# Patient Record
Sex: Male | Born: 1950 | Race: White | Hispanic: No | Marital: Single | Smoking: Current some day smoker
Health system: Southern US, Community
[De-identification: ages and names within clinical notes are randomized; demographics above are authoritative.]

## PROBLEM LIST (undated history)

## (undated) DIAGNOSIS — I480 Paroxysmal atrial fibrillation: Secondary | ICD-10-CM

## (undated) DIAGNOSIS — R9431 Abnormal electrocardiogram [ECG] [EKG]: Secondary | ICD-10-CM

## (undated) DIAGNOSIS — I4892 Unspecified atrial flutter: Secondary | ICD-10-CM

## (undated) DIAGNOSIS — G4730 Sleep apnea, unspecified: Secondary | ICD-10-CM

## (undated) DIAGNOSIS — E7800 Pure hypercholesterolemia, unspecified: Secondary | ICD-10-CM

## (undated) DIAGNOSIS — I10 Essential (primary) hypertension: Secondary | ICD-10-CM

## (undated) HISTORY — DX: Abnormal electrocardiogram (ECG) (EKG): R94.31

## (undated) HISTORY — DX: Essential (primary) hypertension: I10

## (undated) HISTORY — DX: Sleep apnea, unspecified: G47.30

## (undated) HISTORY — DX: Paroxysmal atrial fibrillation: I48.0

---
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
## 2013-06-28 ENCOUNTER — Ambulatory Visit (INDEPENDENT_AMBULATORY_CARE_PROVIDER_SITE_OTHER): Payer: BC Managed Care – PPO | Admitting: Physician Assistant

## 2013-06-28 VITALS — BP 148/86 | HR 78 | Temp 97.9°F | Resp 18 | Ht 70.0 in | Wt 184.0 lb

## 2013-06-28 DIAGNOSIS — M79609 Pain in unspecified limb: Secondary | ICD-10-CM

## 2013-06-28 DIAGNOSIS — M79604 Pain in right leg: Secondary | ICD-10-CM

## 2013-06-28 DIAGNOSIS — L089 Local infection of the skin and subcutaneous tissue, unspecified: Secondary | ICD-10-CM

## 2013-06-28 MED ORDER — DOXYCYCLINE HYCLATE 100 MG PO CAPS: 100.0000 mg | ORAL_CAPSULE | Freq: Two times a day (BID) | ORAL | Status: DC

## 2013-06-28 NOTE — Progress Notes (Signed)
  Subjective:    Patient ID: Jacob Meadows, male    DOB: 1951/10/13, 62 y.o.   MRN: [id number]  HPI 62 y.o. Male presents to clinic today for splinter in his right leg x 4 days. Patient states that it is only painful directly over area where splinter entered and he states that his leg feels kind of heavy. Patient was gardening and has wooden posts in his yard that he hit his leg on. Patient pulled a large piece of wood out and he thought he got it all but the area has since become swollen and red. He has been putting alcohol and antibiotic ointment on it without any relief. His wife advised him that he better come get it looked at. He has not had any fevers, chills, numbness or tingling in is legs, or pain radiating up his leg. Patient up to date on Tetanus shot (2012). Patient takes no medications and has NKDA.    Review of Systems  All other systems reviewed and are negative.      Objective:   Physical Exam  Nursing note and vitals reviewed. Constitutional: He is oriented to person, place, and time. Vital signs are normal. He appears well-developed and well-nourished. No distress.  HENT:  Head: Normocephalic.  Right Ear: External ear normal.  Left Ear: External ear normal.  Nose: Nose normal.  Eyes: Conjunctivae and lids are normal.  Neck: Trachea normal and normal range of motion.  Cardiovascular: Normal rate, regular rhythm and normal heart sounds.   Pulmonary/Chest: Effort normal and breath sounds normal.  Musculoskeletal: Normal range of motion.  Neurological: He is alert and oriented to person, place, and time. He has normal strength. No sensory deficit.  Skin: Skin is warm and dry. Lesion noted. He is not diaphoretic. There is erythema. No pallor.  Erythematous and small fluctuant area on lateral distal area skin over fibula. Scab present due to FB puncture.  Psychiatric: He has a normal mood and affect. His speech is normal and behavior is normal. Judgment and thought content  normal. Cognition and memory are normal.    Procedure: consent obtained for splinter removal. Scab removed and culture obtained of purulent drainage. Not able to identify FB. Area anesthetized with 1 cc of 2% lidocaine. Area cleaned with betadine. Splinter forceps used to explore wound bed and FB removed. Area cleaned and dressed with antibiotic ointment and a Band-Aid. Wound care discussed.     Assessment & Plan:  Foreign body of leg, right, superficial, infected, initial encounter - Plan: Wound culture, doxycycline (VIBRAMYCIN) 100 MG capsule. Instructed patient to take doxycycline as directed.  Instructed patient to wash daily with soap and water. He can  Continue to apply a dab of antibiotic ointment twice daily. He may cover it with a bandage if it's draining, but otherwise, leave it open to the air.  Will contact patient with results of wound culture when they are a available.

## 2013-06-28 NOTE — Patient Instructions (Addendum)
Wash daily with soap and water. Apply a dab of antibiotic ointment twice daily. You may cover it with a bandage if it's draining, but otherwise, leave it open to the air.  I will contact you with your lab results as soon as they are available.   If you have not heard from me in 2 weeks, please contact me.  The fastest way to get your results is to register for My Chart (see the instructions on the last page of this printout).

## 2013-07-02 NOTE — Progress Notes (Signed)
I have examined this patient, I directly supervised and participated in the procedure, and agree with the student's documentation.

## 2013-09-26 ENCOUNTER — Other Ambulatory Visit: Payer: Self-pay

## 2015-07-24 ENCOUNTER — Ambulatory Visit (INDEPENDENT_AMBULATORY_CARE_PROVIDER_SITE_OTHER): Payer: BLUE CROSS/BLUE SHIELD | Admitting: Physician Assistant

## 2015-07-24 VITALS — BP 168/92 | HR 97 | Temp 97.9°F | Resp 18 | Ht 70.0 in | Wt 192.0 lb

## 2015-07-24 DIAGNOSIS — H6502 Acute serous otitis media, left ear: Secondary | ICD-10-CM | POA: Diagnosis not present

## 2015-07-24 DIAGNOSIS — R030 Elevated blood-pressure reading, without diagnosis of hypertension: Secondary | ICD-10-CM

## 2015-07-24 DIAGNOSIS — H60392 Other infective otitis externa, left ear: Secondary | ICD-10-CM

## 2015-07-24 DIAGNOSIS — IMO0001 Reserved for inherently not codable concepts without codable children: Secondary | ICD-10-CM

## 2015-07-24 MED ORDER — AMOXICILLIN 875 MG PO TABS: 875.0000 mg | ORAL_TABLET | Freq: Two times a day (BID) | ORAL | Status: DC

## 2015-07-24 MED ORDER — CIPROFLOXACIN-DEXAMETHASONE 0.3-0.1 % OT SUSP: 4.0000 [drp] | Freq: Two times a day (BID) | OTIC | Status: DC

## 2015-07-24 NOTE — Progress Notes (Signed)
07/24/2015 at 1:18 PM  Barbette Merino / DOB: Dec 09, 1950 / MRN: [id number]  The patient  does not have a problem list on file.  SUBJECTIVE  Jacob Meadows is a 64 y.o. well appearing male presenting for the chief complaint of left sided external ear pain and otorrhea for 5 days.  Reports he went to his audiologist this morning and was advised to come here for further evaluation. He also complains of decreased hearing in the left ear and is constantly "having to wipe my ear." Associates low grade fevers but has not measured. He has been trying pseudoephedrine for this problem without relief.  He has had this problem before roughly 6 years ago.  He denies a history of diabetes and receives annual care from his PCP.  He has a history of "white coat HTN" and is managed by his PCP for the problem.     He  has no past medical history on file.    Medications reviewed and updated by myself where necessary, and exist elsewhere in the encounter.   Jacob Meadows has No Known Allergies. He  reports that he has never smoked. He does not have any smokeless tobacco history on file. He reports that he drinks about 0.6 - 1.8 oz of alcohol per week. He reports that he does not use illicit drugs. He  has no sexual activity history on file. The patient  has no past surgical history on file.  His family history is not on file.  Review of Systems  Constitutional: Positive for fever.  Eyes: Negative for blurred vision.  Respiratory: Negative for cough.   Gastrointestinal: Negative for nausea and abdominal pain.  Skin: Negative for rash.  Neurological: Negative for dizziness and headaches.    OBJECTIVE  His  height is  (1.778 m) and weight is 192 lb (87.091 kg). His oral temperature is 97.9 F (36.6 C). His blood pressure is 168/92 and his pulse is 97. His respiration is 18 and oxygen saturation is 100%.  The patient's body mass index is 27.55 kg/(m^2).  Physical Exam  Vitals reviewed. Constitutional: He  is oriented to person, place, and time. He appears well-developed. No distress.  HENT:  Right Ear: Hearing and tympanic membrane normal.  Left Ear: There is drainage and tenderness. A middle ear effusion is present.  Ears:  Nose: Nose normal.  Mouth/Throat: Uvula is midline, oropharynx is clear and moist and mucous membranes are normal.  Eyes: EOM are normal. Pupils are equal, round, and reactive to light. No scleral icterus.  Neck: Normal range of motion.  Cardiovascular: Normal rate and regular rhythm.   Respiratory: Effort normal and breath sounds normal.  GI: He exhibits no distension.  Musculoskeletal: Normal range of motion.  Neurological: He is alert and oriented to person, place, and time. No cranial nerve deficit.  Skin: Skin is warm and dry. No rash noted. He is not diaphoretic.  Psychiatric: He has a normal mood and affect.    No results found for this or any previous visit (from the past 24 hour(s)).  ASSESSMENT & PLAN  Jacob Meadows was seen today for ear pain.  Diagnoses and all orders for this visit:  Otitis, externa, infective, left -     ciprofloxacin-dexamethasone (CIPRODEX) otic suspension; Place 4 drops into the left ear 2 (two) times daily.  Acute serous otitis media of left ear, recurrence not specified -     amoxicillin (AMOXIL) 875 MG tablet; Take 1 tablet (875 mg total)  by mouth 2 (two) times daily.  Elevated blood pressure: Patient sees his PCP and reports a history of white coat HTN.       The patient was advised to call or come back to clinic if he does not see an improvement in symptoms, or worsens with the above plan.   Deliah Boston, MHS, PA-C [facility] 07/24/2015 1:18 PM

## 2015-09-13 NOTE — Progress Notes (Signed)
  Medical screening examination/treatment/procedure(s) were performed by non-physician practitioner and as supervising physician I was immediately available for consultation/collaboration.     

## 2015-11-24 ENCOUNTER — Ambulatory Visit (INDEPENDENT_AMBULATORY_CARE_PROVIDER_SITE_OTHER): Payer: BLUE CROSS/BLUE SHIELD

## 2015-11-24 ENCOUNTER — Encounter: Payer: Self-pay | Admitting: Internal Medicine

## 2015-11-24 ENCOUNTER — Ambulatory Visit (INDEPENDENT_AMBULATORY_CARE_PROVIDER_SITE_OTHER): Payer: BLUE CROSS/BLUE SHIELD | Admitting: Family Medicine

## 2015-11-24 ENCOUNTER — Ambulatory Visit (INDEPENDENT_AMBULATORY_CARE_PROVIDER_SITE_OTHER): Payer: BLUE CROSS/BLUE SHIELD | Admitting: Internal Medicine

## 2015-11-24 VITALS — BP 176/106 | HR 122 | Temp 98.0°F | Resp 18 | Ht 69.5 in | Wt 201.0 lb

## 2015-11-24 VITALS — BP 180/104 | HR 86 | Ht 70.0 in | Wt 204.0 lb

## 2015-11-24 DIAGNOSIS — R059 Cough, unspecified: Secondary | ICD-10-CM

## 2015-11-24 DIAGNOSIS — R000 Tachycardia, unspecified: Secondary | ICD-10-CM | POA: Diagnosis not present

## 2015-11-24 DIAGNOSIS — R9431 Abnormal electrocardiogram [ECG] [EKG]: Secondary | ICD-10-CM

## 2015-11-24 DIAGNOSIS — R05 Cough: Secondary | ICD-10-CM | POA: Diagnosis not present

## 2015-11-24 DIAGNOSIS — G478 Other sleep disorders: Secondary | ICD-10-CM | POA: Diagnosis not present

## 2015-11-24 DIAGNOSIS — I481 Persistent atrial fibrillation: Secondary | ICD-10-CM

## 2015-11-24 DIAGNOSIS — I10 Essential (primary) hypertension: Secondary | ICD-10-CM | POA: Insufficient documentation

## 2015-11-24 DIAGNOSIS — J0110 Acute frontal sinusitis, unspecified: Secondary | ICD-10-CM

## 2015-11-24 DIAGNOSIS — R030 Elevated blood-pressure reading, without diagnosis of hypertension: Secondary | ICD-10-CM | POA: Diagnosis not present

## 2015-11-24 DIAGNOSIS — I480 Paroxysmal atrial fibrillation: Secondary | ICD-10-CM

## 2015-11-24 DIAGNOSIS — I4819 Other persistent atrial fibrillation: Secondary | ICD-10-CM | POA: Insufficient documentation

## 2015-11-24 DIAGNOSIS — I4891 Unspecified atrial fibrillation: Secondary | ICD-10-CM

## 2015-11-24 DIAGNOSIS — G4730 Sleep apnea, unspecified: Secondary | ICD-10-CM

## 2015-11-24 DIAGNOSIS — IMO0001 Reserved for inherently not codable concepts without codable children: Secondary | ICD-10-CM

## 2015-11-24 HISTORY — DX: Paroxysmal atrial fibrillation: I48.0

## 2015-11-24 HISTORY — DX: Abnormal electrocardiogram (ECG) (EKG): R94.31

## 2015-11-24 HISTORY — DX: Sleep apnea, unspecified: G47.30

## 2015-11-24 HISTORY — DX: Essential (primary) hypertension: I10

## 2015-11-24 LAB — COMPREHENSIVE METABOLIC PANEL
ALT: 44 U/L (ref 9–46)
AST: 22 U/L (ref 10–35)
Albumin: 3.9 g/dL (ref 3.6–5.1)
Alkaline Phosphatase: 68 U/L (ref 40–115)
BILIRUBIN TOTAL: 0.7 mg/dL (ref 0.2–1.2)
BUN: 14 mg/dL (ref 7–25)
CALCIUM: 8.9 mg/dL (ref 8.6–10.3)
CHLORIDE: 103 mmol/L (ref 98–110)
CO2: 24 mmol/L (ref 20–31)
Creat: 0.87 mg/dL (ref 0.70–1.25)
GLUCOSE: 178 mg/dL — AB (ref 65–99)
Potassium: 4.1 mmol/L (ref 3.5–5.3)
SODIUM: 137 mmol/L (ref 135–146)
Total Protein: 5.8 g/dL — ABNORMAL LOW (ref 6.1–8.1)

## 2015-11-24 LAB — MAGNESIUM: Magnesium: 1.8 mg/dL (ref 1.5–2.5)

## 2015-11-24 LAB — CBC WITH DIFFERENTIAL/PLATELET
BASOS ABS: 0 10*3/uL (ref 0.0–0.1)
BASOS PCT: 0 % (ref 0–1)
EOS ABS: 0.1 10*3/uL (ref 0.0–0.7)
Eosinophils Relative: 1 % (ref 0–5)
HCT: 39.4 % (ref 39.0–52.0)
Hemoglobin: 13.4 g/dL (ref 13.0–17.0)
Lymphocytes Relative: 13 % (ref 12–46)
Lymphs Abs: 1.1 10*3/uL (ref 0.7–4.0)
MCH: 31 pg (ref 26.0–34.0)
MCHC: 34 g/dL (ref 30.0–36.0)
MCV: 91.2 fL (ref 78.0–100.0)
MONOS PCT: 8 % (ref 3–12)
MPV: 8.6 fL (ref 8.6–12.4)
Monocytes Absolute: 0.7 10*3/uL (ref 0.1–1.0)
NEUTROS PCT: 78 % — AB (ref 43–77)
Neutro Abs: 6.5 10*3/uL (ref 1.7–7.7)
PLATELETS: 281 10*3/uL (ref 150–400)
RBC: 4.32 MIL/uL (ref 4.22–5.81)
RDW: 13.8 % (ref 11.5–15.5)
WBC: 8.3 10*3/uL (ref 4.0–10.5)

## 2015-11-24 LAB — TSH: TSH: 1.753 u[IU]/mL (ref 0.350–4.500)

## 2015-11-24 MED ORDER — APIXABAN 5 MG PO TABS: 5.0000 mg | ORAL_TABLET | Freq: Two times a day (BID) | ORAL | Status: DC

## 2015-11-24 MED ORDER — CEFDINIR 300 MG PO CAPS: 300.0000 mg | ORAL_CAPSULE | Freq: Two times a day (BID) | ORAL | Status: DC

## 2015-11-24 MED ORDER — METOPROLOL TARTRATE 50 MG PO TABS: 50.0000 mg | ORAL_TABLET | Freq: Two times a day (BID) | ORAL | Status: DC

## 2015-11-24 NOTE — Progress Notes (Signed)
ELECTROPHYSIOLOGY CONSULT NOTE  Patient ID: Jacob Meadows, MRN: [id number], DOB/AGE: 66/01/1951 66 y.o. Admit date: (Not on file) Date of Consult: 11/24/2015  Primary Physician: [name],[name], MD Primary Cardiologist: new  Chief Complaint *atrial fibrillation    HPI Jacob Meadows is a 65 y.o. male  Referred from primary care after he presented this morning with a six-week history of worsening exercise intolerance faint sense of palpitations and being ill at ease. He was found to be in atrial fibrillation with modestly rapid rate. He has not had lightheadedness. He has had some mild dyspnea. He has not had chest pain. He has not had edema.  He has hypertension and a CHADS-VASc score of 2  No family history of heart disease No history of bleeding issues   Past Medical History  Diagnosis Date  . Essential hypertension 11/24/2015  . Persistent atrial fibrillation (HCC) 11/24/2015      Surgical History: No past surgical history on file.   Home Meds: Prior to Admission medications   Medication Sig Start Date End Date Taking? Authorizing Provider  cefdinir (OMNICEF) 300 MG capsule Take 1 capsule (300 mg total) by mouth 2 (two) times daily. 11/24/15  Yes Gwenlyn FoundJessica C Copland, MD  metoprolol (LOPRESSOR) 50 MG tablet Take 1 tablet (50 mg total) by mouth 2 (two) times daily. Increase dose as directed 11/24/15  Yes Pearline CablesJessica C Copland, MD    Allergies: No Known Allergies  Social History   Social History  . Marital Status: Married    Spouse Name: N/A  . Number of Children: N/A  . Years of Education: N/A   Occupational History  . Not on file.   Social History Main Topics  . Smoking status: Never Smoker   . Smokeless tobacco: Never Used  . Alcohol Use: 0.6 - 1.8 oz/week    1-3 Standard drinks or equivalent per week  . Drug Use: No  . Sexual Activity: Not on file   Other Topics Concern  . Not on file   Social History Narrative     Family History  Problem Relation Age of  Onset  . Alzheimer's disease Mother   . Congestive Heart Failure Father      ROS:  Please see the history of present illness.    All other systems reviewed and negative.    Physical Exam:   Blood pressure 180/104, pulse 86, height 5\' 10"  (1.778 m), weight 204 lb (92.534 kg). General: Well developed, well nourished male in no acute distress. Head: Normocephalic, atraumatic, sclera non-icteric, no xanthomas, nares are without discharge. EENT: normal  Lymph Nodes:  none Neck: Negative for carotid bruits. JVD 8-10 Back:without scoliosis kyphosis  Lungs: Clear bilaterally to auscultation without wheezes, rales, or rhonchi. Breathing is unlabored. Heart  Irregularly irregular RR with S1 S2. No  systolic  murmur . No rubs, or gallops appreciated. Abdomen: Soft, non-tender, non-distended with normoactive bowel sounds. No hepatomegaly. No rebound/guarding. No obvious abdominal masses. Msk:  Strength and tone appear normal for age. Extremities: No clubbing or cyanosis. No*  edema.  Distal pedal pulses are 2+ and equal bilaterally. Skin: Warm and Dry Neuro: Alert and oriented X 3. CN III-XII intact Grossly normal sensory and motor function . Psych:  Responds to questions appropriately with a normal affect.      Labs: Cardiac Enzymes No results for input(s): CKTOTAL, CKMB, TROPONINI in the last 72 hours. CBC No results found for: WBC, HGB, HCT, MCV, PLT PROTIME: No results for  input(s): LABPROT, INR in the last 72 hours. Chemistry No results for input(s): NA, K, CL, CO2, BUN, CREATININE, CALCIUM, PROT, BILITOT, ALKPHOS, ALT, AST, GLUCOSE in the last 168 hours.  Invalid input(s): LABALBU Lipids No results found for: CHOL, HDL, LDLCALC, TRIG BNP No results found for: PROBNP Thyroid Function Tests: No results for input(s): TSH, T4TOTAL, T3FREE, THYROIDAB in the last 72 hours.  Invalid input(s): FREET3 Miscellaneous No results found for: DDIMER  Radiology/Studies:  Dg Chest 2  View  11/24/2015  CLINICAL DATA:  Two weeks of chest discomfort and cough, history of atrial fibrillation. EXAM: CHEST  2 VIEW COMPARISON:  None in PACs FINDINGS: The lungs are well-expanded. There is mild hemidiaphragm flattening on the lateral image. There is no alveolar infiltrate. The interstitial markings are mildly increased in the lower lung zones. There is no significant pleural effusion. The cardiac silhouette is enlarged. The pulmonary vascularity is mildly prominent centrally. The bony thorax exhibits no acute abnormality. IMPRESSION: 1. Mild bibasilar interstitial prominence may reflect acute bronchitic change. No discrete pneumonia is evident. 2. Mild cardiomegaly with very mild central pulmonary vascular prominence may reflect low-grade compensated CHF. Electronically Signed   By: David  [place] M.D.   On: 11/24/2015 12:01    EKG: atrial fibrillation at 106 Intervals-/09/33 Low-voltage limb leads   Assessment and Plan: * Atrial fibrillation-persistent  Hypertension  Congestive heart failure  Sleep disordered breathing  Low voltage limb leads    The patient has symptoms of exercise intolerance dating back about 6 weeks to Thanksgiving. I suspect he has been in atrial fibrillation since then easy as he had some concomitant palpitations.  We have reviewed the physiology of atrial fibrillation specifically, we have reviewed the timing and priming function of the atrium, the impact on rate because of atrial fibrillation high rates and our efforts to attenuate ventricular response by pharmacological manipulation of the AV node.  We discussed the potential effects on exercise tolerance related to rate irregularity and loss of priming function we discussed potential risks of thromboembolism. He has a CHADS-VASc score of greater than or equal to 2 . We discussed the use of the NOACs compared to Coumadin. We briefly reviewed the data of at least comparability in stroke prevention,  bleeding and outcome. We discussed some of the new once wherein somewhat associated with decreased ischemic stroke risk, one to be taken daily, and has been shown to be comparable and bleeding risk to aspirin.  We also discussed bleeding associated with warfarin as well as NOACs and a wall bleeding as a complication of all these drugs intracranial bleeding is more frequently associated with warfarin then the NOACs and a GI bleeding is more commonly associated with the latter  We will begin him on apixaban.  He was started on metoprolol 50 mg twice daily earlier today. This will be important for both rate control as well as effect on his blood pressure. I suspect he will need further antihypertensive therapy. Once we obtain his echocardiogram, if there is evidence of left ventricular hypertrophy use an ARB  We will look at his LV function and determine thereafter timing of cardioversion. If there is impaired LV function we will undertake cardioversion urgently otherwise we will think cardioversion in about 3 weeks time.  We have discussed the long-term likelihood of being sustained in sinus rhythm and the potential use of antiarrhythmic therapy and/or catheter ablation being informed by the frequency of recurrences  With the low voltage limb leads  Will have be  attentive to ventricular wall thickness   Sherryl Manges

## 2015-11-24 NOTE — Progress Notes (Signed)
[facility] [address], [place] [address] [phone]  Date:  11/24/2015   Name:  Jacob Meadows   DOB:  1951/06/16   MRN:  [id number]  PCP:  Jacob Kayser, MD    Chief Complaint: Sinusitis and Cough   History of Present Illness:  Jacob Meadows is a 65 y.o. very pleasant male patient who presents with the following:  "Jacob Meadows" is here today with ilness- he has noted possible sinus infection for about 2 weeks with facial and sinus congestion and pain.  Never a smoker Generally in good health  Over the summer he got water in the left ear which led to an ear infection- this was treated with amoxicillin and ciprodex in September and he finally did get better.   About 3 weeks ago he noted a lot of mucus and congestion in his sinuses.  He would have presure and pain when he bends over especially.  Worse for the last 2 weeks He was coughing up a lot of PND in the am.   He started wheezing about 3 days ago- he does not generally have any wheezing.   He has not noted any LE edema.  He does need to prop up to breathe at night but thinks this is due to nasal congestion and not to his lungs  He is taking some mucinex D.  He has "just not felt well" over the last 5-6 days.  Has noted some palpitations when asked about this.  However no CP or SOB.   He has felt a little shaky, just not quite himself  He does not have any heart concerns.  Never had a fib.  Does not have a cardiologist.  Never had CHF.  Never had a stress test  Wt Readings from Last 3 Encounters:  11/24/15 201 lb (91.173 kg)  07/24/15 192 lb (87.091 kg)  06/28/13 184 lb (83.462 kg)      BP Readings from Last 3 Encounters:  11/24/15 176/106  07/24/15 168/92  06/28/13 148/86     There are no active problems to display for this patient.   History reviewed. No pertinent past medical history.  History reviewed. No pertinent past surgical history.  Social History  Substance Use Topics  .  Smoking status: Never Smoker   . Smokeless tobacco: None  . Alcohol Use: 0.6 - 1.8 oz/week    1-3 Standard drinks or equivalent per week    Family History  Problem Relation Age of Onset  . Alzheimer's disease Mother   . Congestive Heart Failure Father     No Known Allergies  Medication list has been reviewed and updated.  No current outpatient prescriptions on file prior to visit.   No current facility-administered medications on file prior to visit.    Review of Systems:  As per HPI- otherwise negative.   Physical Examination: Filed Vitals:   11/24/15 1042  BP: 176/106  Pulse: 122  Temp: 98 F (36.7 C)  Resp: 18   Filed Vitals:   11/24/15 1042  Height: 5' 9.5" (1.765 m)  Weight: 201 lb (91.173 kg)   Body mass index is 29.27 kg/(m^2). Ideal Body Weight: Weight in (lb) to have BMI = 25: 171.4  GEN: WDWN, NAD, Non-toxic, A & O x 3, looks well HEENT: Atraumatic, Normocephalic. Neck supple. No masses, No LAD.  Bilateral TM wnl, oropharynx normal.  PEERL,EOMI.   Ears and Nose: No external deformity. CV: rapid irregular rate. No M/G/R.  No JVD. No thrill. No extra heart sounds. PULM: CTA B, no wheezes, crackles, rhonchi. No retractions. No resp. distress. No accessory muscle use. ABD: S, NT, ND, +BS. No rebound. No HSM. EXTR: No c/c/e NEURO Normal gait.  PSYCH: Normally interactive. Conversant. Not depressed or anxious appearing.  Calm demeanor.   EKG: atrial fib with rate 100- 120; RVR  [facility] reading (PRIMARY) by  Dr. Patsy Lageropland. CXR: mild cardiomegaly with mild pulmonary edema  CHEST 2 VIEW  COMPARISON: None in PACs  FINDINGS: The lungs are well-expanded. There is mild hemidiaphragm flattening on the lateral image. There is no alveolar infiltrate. The interstitial markings are mildly increased in the lower lung zones. There is no significant pleural effusion. The cardiac silhouette is enlarged. The pulmonary vascularity is mildly prominent centrally. The  bony thorax exhibits no acute abnormality.  IMPRESSION: 1. Mild bibasilar interstitial prominence may reflect acute bronchitic change. No discrete pneumonia is evident. 2. Mild cardiomegaly with very mild central pulmonary vascular prominence may reflect low-grade compensated CHF.  Assessment and Plan: Atrial fibrillation, unspecified type (HCC)  Tachycardia - Plan: EKG 12-Lead, DG Chest 2 View, metoprolol (LOPRESSOR) 50 MG tablet  Elevated BP  Acute frontal sinusitis, recurrence not specified - Plan: cefdinir (OMNICEF) 300 MG capsule  Cough - Plan: DG Chest 2 View  Here today with concern of a sinus infection- however also noted to be in A fib with borderline RVR and evidence of pulmonary congestion. Spoke with Dr. Graciela HusbandsKlein who kindly advised me to start him on metoprolol 50 BID and who will see the pt in clinic this afternoon.    rx for omnicef for a sinus infection  Gave him bottle of xarelto 15 mg samples in case this may be useful- asked him to consult with Dr. Graciela HusbandsKlein about the right anticoagulation for him  Signed Abbe AmsterdamJessica Copland, MD

## 2015-11-24 NOTE — Patient Instructions (Addendum)
[facility] this afternoon at 2pm to see Dr. Graciela HusbandsKlein at [facility]:30  [facility] [address] [place], [place] [address] Main: [phone]   In the meantime we are going to start you on an antibiotic (omnicef) for your sinuses and a beta blocker medication (metoprolol) to slow down your heart and bring down your blood pressure

## 2015-11-24 NOTE — Patient Instructions (Addendum)
Medication Instructions: 1) Start Eliquis 5 mg one tablet by mouth twice daily 2) Stop Aspirin  Labwork: - Your physician recommends that you have lab work today: CMET/ CBC/ Magnesium/ TSH  Procedures/Testing: - Your physician has requested that you have an echocardiogram (Friday 11/27/15 at 1:00 pm). Echocardiography is a painless test that uses sound waves to create images of your heart. It provides your doctor with information about the size and shape of your heart and how well your heart's chambers and valves are working. This procedure takes approximately one hour. There are no restrictions for this procedure.  - Your physician has recommended that you have a home sleep study. This test records several body functions during sleep, including: brain activity, eye movement, oxygen and carbon dioxide blood levels, heart rate and rhythm, breathing rate and rhythm, the flow of air through your mouth and nose, snoring, body muscle movements, and chest and belly movement.  Follow-Up: - Pending the results of your echocardiogram.  Any Additional Special Instructions Will Be Listed Below (If Applicable).

## 2015-11-27 ENCOUNTER — Other Ambulatory Visit (HOSPITAL_COMMUNITY): Payer: Self-pay | Admitting: Internal Medicine

## 2015-11-27 ENCOUNTER — Other Ambulatory Visit: Payer: Self-pay | Admitting: *Deleted

## 2015-11-27 ENCOUNTER — Ambulatory Visit (HOSPITAL_COMMUNITY): Payer: BLUE CROSS/BLUE SHIELD | Attending: Cardiovascular Disease

## 2015-11-27 ENCOUNTER — Other Ambulatory Visit: Payer: Self-pay

## 2015-11-27 ENCOUNTER — Other Ambulatory Visit (INDEPENDENT_AMBULATORY_CARE_PROVIDER_SITE_OTHER): Payer: BLUE CROSS/BLUE SHIELD | Admitting: *Deleted

## 2015-11-27 DIAGNOSIS — I10 Essential (primary) hypertension: Secondary | ICD-10-CM | POA: Diagnosis not present

## 2015-11-27 DIAGNOSIS — R7309 Other abnormal glucose: Secondary | ICD-10-CM | POA: Diagnosis not present

## 2015-11-27 DIAGNOSIS — I481 Persistent atrial fibrillation: Secondary | ICD-10-CM | POA: Diagnosis not present

## 2015-11-27 DIAGNOSIS — I4819 Other persistent atrial fibrillation: Secondary | ICD-10-CM

## 2015-11-27 DIAGNOSIS — I517 Cardiomegaly: Secondary | ICD-10-CM | POA: Insufficient documentation

## 2015-11-27 LAB — HEMOGLOBIN A1C
HEMOGLOBIN A1C: 6.6 % — AB (ref <5.7)
Mean Plasma Glucose: 143 mg/dL — ABNORMAL HIGH (ref <117)

## 2015-12-03 ENCOUNTER — Encounter: Payer: Self-pay | Admitting: *Deleted

## 2015-12-03 ENCOUNTER — Telehealth: Payer: Self-pay | Admitting: Internal Medicine

## 2015-12-03 NOTE — Telephone Encounter (Signed)
Call placed to the patient today with the date/ time for his DCCV. He is scheduled for 12/16/15 at 10:00 am with Dr. Rennis GoldenHilty. Verbal instructions given to the patient. A letter will also be mailed- mailing address confirmed with the patient.

## 2015-12-07 ENCOUNTER — Other Ambulatory Visit: Payer: Self-pay | Admitting: *Deleted

## 2015-12-07 DIAGNOSIS — G4733 Obstructive sleep apnea (adult) (pediatric): Secondary | ICD-10-CM

## 2015-12-16 ENCOUNTER — Ambulatory Visit (HOSPITAL_COMMUNITY): Payer: BLUE CROSS/BLUE SHIELD | Admitting: Anesthesiology

## 2015-12-16 ENCOUNTER — Ambulatory Visit (HOSPITAL_COMMUNITY)
Admission: RE | Admit: 2015-12-16 | Discharge: 2015-12-16 | Disposition: A | Discharge status: Home / Self Care | Payer: BLUE CROSS/BLUE SHIELD | Source: Ambulatory Visit | Attending: Internal Medicine | Admitting: Internal Medicine

## 2015-12-16 ENCOUNTER — Encounter (HOSPITAL_COMMUNITY): Payer: Self-pay | Admitting: *Deleted

## 2015-12-16 ENCOUNTER — Encounter (HOSPITAL_COMMUNITY)
Admission: RE | Disposition: A | Discharge status: Home / Self Care | Payer: Self-pay | Source: Ambulatory Visit | Attending: Internal Medicine

## 2015-12-16 DIAGNOSIS — I509 Heart failure, unspecified: Secondary | ICD-10-CM | POA: Insufficient documentation

## 2015-12-16 DIAGNOSIS — I110 Hypertensive heart disease with heart failure: Secondary | ICD-10-CM | POA: Diagnosis not present

## 2015-12-16 DIAGNOSIS — I481 Persistent atrial fibrillation: Secondary | ICD-10-CM | POA: Insufficient documentation

## 2015-12-16 DIAGNOSIS — I4891 Unspecified atrial fibrillation: Secondary | ICD-10-CM | POA: Diagnosis present

## 2015-12-16 HISTORY — PX: CARDIOVERSION: SHX1299

## 2015-12-16 SURGERY — CARDIOVERSION
Anesthesia: Monitor Anesthesia Care

## 2015-12-16 MED ORDER — LIDOCAINE HCL (CARDIAC) 20 MG/ML IV SOLN
INTRAVENOUS | Status: DC | PRN
  Administered 2015-12-16: 80 mg via INTRATRACHEAL

## 2015-12-16 MED ORDER — PROPOFOL 10 MG/ML IV BOLUS
INTRAVENOUS | Status: DC | PRN
  Administered 2015-12-16: 40 mg via INTRAVENOUS
  Administered 2015-12-16: 80 mg via INTRAVENOUS

## 2015-12-16 MED ORDER — SODIUM CHLORIDE 0.9 % IV SOLN
INTRAVENOUS | Status: DC
  Administered 2015-12-16: 10:00:00 via INTRAVENOUS

## 2015-12-16 NOTE — H&P (Signed)
     INTERVAL PROCEDURE H&P  History and Physical Interval Note:  12/16/2015 9:40 AM  Jacob Meadows has presented today for their planned procedure. The various methods of treatment have been discussed with the patient and family. After consideration of risks, benefits and other options for treatment, the patient has consented to the procedure.  The patients' outpatient history has been reviewed, patient examined, and no change in status from most recent office note within the past 30 days. I have reviewed the patients' chart and labs and will proceed as planned. Questions were answered to the patient's satisfaction.   Chrystie Nose, MD, [facility] Attending Cardiologist [facility]  Chrystie Nose 12/16/2015, 9:40 AM

## 2015-12-16 NOTE — CV Procedure (Signed)
    CARDIOVERSION NOTE  Procedure: Electrical Cardioversion Indications:  Atrial Fibrillation  Procedure Details:  Consent: Risks of procedure as well as the alternatives and risks of each were explained to the (patient/caregiver).  Consent for procedure obtained.  Time Out: Verified patient identification, verified procedure, site/side was marked, verified correct patient position, special equipment/implants available, medications/allergies/relevent history reviewed, required imaging and test results available.  Performed  Patient placed on cardiac monitor, pulse oximetry, supplemental oxygen as necessary.  Sedation given: Propofol per anesthesia Pacer pads placed anterior and posterior chest.  Cardioverted 1 time(s).  Cardioverted at 150J biphasic.  Impression: Findings: Post procedure EKG shows: NSR Complications: None Patient did tolerate procedure well.  Plan: 1. Successful DCCV to NSR with a single 150J biphasic shock.   Time Spent Directly with the Patient:  30 minutes   Chrystie Nose, MD, [facility] Attending Cardiologist [facility]  Chrystie Nose 12/16/2015, 10:13 AM

## 2015-12-16 NOTE — Transfer of Care (Signed)
Immediate Anesthesia Transfer of Care Note  Patient: Jacob Meadows  Procedure(s) Performed: Procedure(s): CARDIOVERSION (N/A)  Patient Location: Endoscopy Unit  Anesthesia Type:MAC  Level of Consciousness: awake, alert  and oriented  Airway & Oxygen Therapy: Patient Spontanous Breathing and Patient connected to nasal cannula oxygen  Post-op Assessment: Report given to RN, Post -op Vital signs reviewed and stable and Patient moving all extremities  Post vital signs: Reviewed and stable  Last Vitals:  Filed Vitals:   12/16/15 0901  BP: 175/119  Pulse: 86  Resp: 15    Complications: No apparent anesthesia complications

## 2015-12-16 NOTE — Anesthesia Procedure Notes (Signed)
Procedure Name: MAC Date/Time: 12/16/2015 9:53 AM Performed by: Orvilla Fus A Pre-anesthesia Checklist: Patient identified, Emergency Drugs available, Suction available and Patient being monitored Patient Re-evaluated:Patient Re-evaluated prior to inductionOxygen Delivery Method: Ambu bag Preoxygenation: Pre-oxygenation with 100% oxygen

## 2015-12-16 NOTE — Discharge Instructions (Signed)
Moderate Conscious Sedation, Adult °Sedation is the use of medicines to promote relaxation and relieve discomfort and anxiety. Moderate conscious sedation is a type of sedation. Under moderate conscious sedation you are less alert than normal but are still able to respond to instructions or stimulation. Moderate conscious sedation is used during short medical and dental procedures. It is milder than deep sedation or general anesthesia and allows you to return to your regular activities sooner. °LET YOUR HEALTH CARE PROVIDER KNOW ABOUT:  °· Any allergies you have. °· All medicines you are taking, including vitamins, herbs, eye drops, creams, and over-the-counter medicines. °· Use of steroids (by mouth or creams). °· Previous problems you or members of your family have had with the use of anesthetics. °· Any blood disorders you have. °· Previous surgeries you have had. °· Medical conditions you have. °· Possibility of pregnancy, if this applies. °· Use of cigarettes, alcohol, or illegal drugs. °RISKS AND COMPLICATIONS °Generally, this is a safe procedure. However, as with any procedure, problems can occur. Possible problems include: °· Oversedation. °· Trouble breathing on your own. You may need to have a breathing tube until you are awake and breathing on your own. °· Allergic reaction to any of the medicines used for the procedure. °BEFORE THE PROCEDURE °· You may have blood tests done. These tests can help show how well your kidneys and liver are working. They can also show how well your blood clots. °· A physical exam will be done.   °· Only take medicines as directed by your health care provider. You may need to stop taking medicines (such as blood thinners, aspirin, or nonsteroidal anti-inflammatory drugs) before the procedure.   °· Do not eat or drink at least 6 hours before the procedure or as directed by your health care provider. °· Arrange for a responsible adult, family member, or friend to take you home  after the procedure. He or she should stay with you for at least 24 hours after the procedure, until the medicine has worn off. °PROCEDURE  °· An intravenous (IV) catheter will be inserted into one of your veins. Medicine will be able to flow directly into your body through this catheter. You may be given medicine through this tube to help prevent pain and help you relax. °· The medical or dental procedure will be done. °AFTER THE PROCEDURE °· You will stay in a recovery area until the medicine has worn off. Your blood pressure and pulse will be checked.   °·  Depending on the procedure you had, you may be allowed to go home when you can tolerate liquids and your pain is under control. °  °This information is not intended to replace advice given to you by your health care provider. Make sure you discuss any questions you have with your health care provider. °  °Document Released: 08/02/2001 Document Revised: 11/28/2014 Document Reviewed: 07/15/2013 °Elsevier Interactive Patient Education ©2016 [employer] °Electrical Cardioversion, Care After °Refer to this sheet in the next few weeks. These instructions provide you with information on caring for yourself after your procedure. Your health care provider may also give you more specific instructions. Your treatment has been planned according to current medical practices, but problems sometimes occur. Call your health care provider if you have any problems or questions after your procedure. °WHAT TO EXPECT AFTER THE PROCEDURE °After your procedure, it is typical to have the following sensations: °· Some redness on the skin where the shocks were delivered. If this is tender, a sunburn   lotion or hydrocortisone cream may help. °· Possible return of an abnormal heart rhythm within hours or days after the procedure. °HOME CARE INSTRUCTIONS °· Take medicines only as directed by your health care provider. Be sure you understand how and when to take your medicine. °· Learn  how to feel your pulse and check it often. °· Limit your activity for 48 hours after the procedure or as directed by your health care provider. °· Avoid or minimize caffeine and other stimulants as directed by your health care provider. °SEEK MEDICAL CARE IF: °· You feel like your heart is beating too fast or your pulse is not regular. °· You have any questions about your medicines. °· You have bleeding that will not stop. °SEEK IMMEDIATE MEDICAL CARE IF: °· You are dizzy or feel faint. °· It is hard to breathe or you feel short of breath. °· There is a change in discomfort in your chest. °· Your speech is slurred or you have trouble moving an arm or leg on one side of your body. °· You get a serious muscle cramp that does not go away. °· Your fingers or toes turn cold or blue. °  °This information is not intended to replace advice given to you by your health care provider. Make sure you discuss any questions you have with your health care provider. °  °Document Released: 08/28/2013 Document Revised: 11/28/2014 Document Reviewed: 08/28/2013 °Elsevier Interactive Patient Education ©2016 [employer] ° °

## 2015-12-16 NOTE — Anesthesia Postprocedure Evaluation (Signed)
Anesthesia Post Note  Patient: Jacob Meadows  Procedure(s) Performed: Procedure(s) (LRB): CARDIOVERSION (N/A)  Patient location during evaluation: Endoscopy Anesthesia Type: MAC Level of consciousness: awake and alert, awake, patient cooperative and oriented Pain management: pain level controlled Vital Signs Assessment: post-procedure vital signs reviewed and stable Respiratory status: spontaneous breathing, nonlabored ventilation, respiratory function stable and patient connected to nasal cannula oxygen Cardiovascular status: stable and blood pressure returned to baseline Anesthetic complications: no    Last Vitals:  Filed Vitals:   12/16/15 0901 12/16/15 1004  BP: 175/119   Pulse: 86 87  Temp:  36.6 C  Resp: 15 17    Last Pain: There were no vitals filed for this visit.               Cecile Hearing

## 2015-12-16 NOTE — Anesthesia Preprocedure Evaluation (Addendum)
Anesthesia Evaluation  Patient identified by MRN, date of birth, ID band Patient awake    Reviewed: Allergy & Precautions, NPO status , Patient's Chart, lab work & pertinent test results, reviewed documented beta blocker date and time   Airway Mallampati: II  TM Distance: >3 FB Neck ROM: Full    Dental  (+) Teeth Intact, Dental Advisory Given   Pulmonary neg pulmonary ROS,    Pulmonary exam normal breath sounds clear to auscultation       Cardiovascular Exercise Tolerance: Good hypertension, Pt. on medications and Pt. on home beta blockers negative cardio ROS Normal cardiovascular exam+ dysrhythmias Atrial Fibrillation  Rhythm:Regular Rate:Normal     Neuro/Psych negative neurological ROS  negative psych ROS   GI/Hepatic negative GI ROS, Neg liver ROS,   Endo/Other  negative endocrine ROS  Renal/GU negative Renal ROS     Musculoskeletal negative musculoskeletal ROS (+)   Abdominal   Peds  Hematology  (+) Blood dyscrasia (Eliquis), ,   Anesthesia Other Findings Day of surgery medications reviewed with the patient.  Reproductive/Obstetrics                            Anesthesia Physical Anesthesia Plan  ASA: III  Anesthesia Plan: MAC   Post-op Pain Management:    Induction: Intravenous  Airway Management Planned: Nasal Cannula  Additional Equipment:   Intra-op Plan:   Post-operative Plan:   Informed Consent: I have reviewed the patients History and Physical, chart, labs and discussed the procedure including the risks, benefits and alternatives for the proposed anesthesia with the patient or authorized representative who has indicated his/her understanding and acceptance.   Dental advisory given  Plan Discussed with: CRNA and Anesthesiologist  Anesthesia Plan Comments: (Discussed risks/benefits/alternatives to MAC sedation including need for ventilatory support, hypotension,  need for conversion to general anesthesia.  All patient questions answered.  Patient wished to proceed.)        Anesthesia Quick Evaluation

## 2015-12-17 ENCOUNTER — Encounter (HOSPITAL_COMMUNITY): Payer: Self-pay | Admitting: Internal Medicine

## 2015-12-23 ENCOUNTER — Encounter: Payer: Self-pay | Admitting: Internal Medicine

## 2015-12-23 ENCOUNTER — Other Ambulatory Visit: Payer: Self-pay | Admitting: Internal Medicine

## 2015-12-23 ENCOUNTER — Ambulatory Visit (INDEPENDENT_AMBULATORY_CARE_PROVIDER_SITE_OTHER): Payer: BLUE CROSS/BLUE SHIELD | Admitting: Internal Medicine

## 2015-12-23 VITALS — BP 140/88 | HR 115 | Ht 70.0 in | Wt 189.1 lb

## 2015-12-23 DIAGNOSIS — R000 Tachycardia, unspecified: Secondary | ICD-10-CM | POA: Diagnosis not present

## 2015-12-23 HISTORY — PX: CARDIOVERSION: SHX1299

## 2015-12-23 MED ORDER — LOSARTAN POTASSIUM 50 MG PO TABS: 50.0000 mg | ORAL_TABLET | Freq: Every day | ORAL | Status: DC

## 2015-12-23 MED ORDER — APIXABAN 5 MG PO TABS: 5.0000 mg | ORAL_TABLET | Freq: Two times a day (BID) | ORAL | Status: DC

## 2015-12-23 MED ORDER — FLECAINIDE ACETATE 100 MG PO TABS: 100.0000 mg | ORAL_TABLET | Freq: Two times a day (BID) | ORAL | Status: DC

## 2015-12-23 MED ORDER — METOPROLOL TARTRATE 50 MG PO TABS: 100.0000 mg | ORAL_TABLET | Freq: Two times a day (BID) | ORAL | Status: DC

## 2015-12-23 NOTE — Patient Instructions (Addendum)
Medication Instructions: 1) Increase lopressor 50 mg, take 2 tablets ( ) twice daily.  2) Start losartan  daily  3) Start Flecainide  twice daily   Labwork: Your physician recommends that you return for lab work same day as stress test.-BMP   Procedures/Testing: Your physician has recommended that you have a Cardioversion (DCCV). Electrical Cardioversion uses a jolt of electricity to your heart either through paddles or wired patches attached to your chest. This is a controlled, usually prescheduled, procedure. Defibrillation is done under light anesthesia in the hospital, and you usually go home the day of the procedure. This is done to get your heart back into a normal rhythm. You are not awake for the procedure.   Your physician has requested that you have en exercise stress myoview. We will schedule this test 1-2 weeks post cardioversion.    Follow-Up: Your physician recommends that you schedule a follow-up appointment in 3 weeks with Rudi Coco PA. In the [facility].    Any Additional Special Instructions Will Be Listed Below (If Applicable).     If you need a refill on your cardiac medications before your next appointment, please call your pharmacy.

## 2015-12-23 NOTE — Progress Notes (Signed)
      Patient Care Team: Rodrigo Ran, MD as PCP - General (Internal Medicine)   HPI  Jacob Meadows is a 65 y.o. male Seen in follow-up for persistent atrial fibrillation for which  he underwent cardioversion 1/17. He comes in today had reverted to rapid atrial fibrillation.echocardiogram 1/17 demonstrated mild left atrial enlargement normal left ventricular function; there is moderate left ventricular hypertrophy 12-13 mm  He has been treated with metoprolol and apixaban  He has sleep disordered breathing and hypertension and has a sleep study pending.  This improved significantly following treatment of the associated heart failure.  He has no symptoms now with reversion to atrial fibrillation not withstanding heart rate in the 120   Records and Results Reviewedhospital records  Past Medical History  Diagnosis Date  . Essential hypertension 11/24/2015  . Persistent atrial fibrillation (HCC) 11/24/2015  . Sleep-disordered breathing 11/24/2015  . Nonspecific abnormal electrocardiogram (ECG) low voltage limb leads 11/24/2015    Past Surgical History  Procedure Laterality Date  . Tonsillectomy      age 43  . Cardioversion N/A 12/16/2015    Procedure: CARDIOVERSION;  Surgeon: Chrystie Nose, MD;  Location: [facility] ENDOSCOPY;  Service: Cardiovascular;  Laterality: N/A;    Current Outpatient Prescriptions  Medication Sig Dispense Refill  . apixaban (ELIQUIS) 5 MG TABS tablet Take 1 tablet (5 mg total) by mouth 2 (two) times daily. 60 tablet 0  . ibuprofen (ADVIL,MOTRIN) 400 MG tablet Take 400 mg by mouth every 6 (six) hours as needed for mild pain.    . metoprolol (LOPRESSOR) 50 MG tablet Take 1 tablet (50 mg total) by mouth 2 (two) times daily. Increase dose as directed 100 tablet 3   No current facility-administered medications for this visit.    No Known Allergies    Review of Systems negative except from HPI and PMH  Physical Exam BP 140/88 mmHg  Pulse 115  Ht  (1.778  m)  Wt 189 lb 2 oz (85.787 kg)  BMI 27.14 kg/m2 Well developed and well nourished in no acute distress HENT normal E scleral and icterus clear Neck Supple JVP flat; carotids brisk and full Clear to ausculation  Regular rate and rhythm, no murmurs gallops or rub Soft with active bowel sounds No clubbing cyanosis  Edema Alert and oriented, grossly normal motor and sensory function Skin Warm and Dry  ECG demonstrates atrial fibrillation at a rate of 122. Intervals-/08/32  Assessment and  Plan  Atrial fibrillation-persistent rapid ventricular response  Hypertension  HFpEF-resolved   The patient's blood pressure is quite high. His atrial fibrillation is rapid.In the interim we will increase his metoprolol from 50--100 mg twice daily.  He reverted to atrial fibrillation shortly after cardioversion; we will begin him on flecainide 100 mg twice daily with the anticipation of repeat cardioversion next week.  He will need outpatient Myoview stress testing thereafter.  Following his cardioversion, I will begin him on losartan 50 mg a day. He will need a metabolic profile about one week after that. This can be done at the time of his cardioversion.  We have broached the subject of catheter ablation; in the absence of significant symptoms, it may be a little bit premature.

## 2015-12-25 ENCOUNTER — Telehealth: Payer: Self-pay | Admitting: *Deleted

## 2015-12-25 NOTE — Telephone Encounter (Signed)
Scheduled patient for DCCV 01/04/16.  (per office visit w/ Graciela Husbands on 2/01). He will have any needed pre procedure labs at the hospital. Patient aware to arrive at hospital at 8:30 a.m. the morning of procedure. NPO after MN the night before procedure. Patient verbalized understanding and agreeable to plan.

## 2016-01-02 ENCOUNTER — Other Ambulatory Visit: Payer: Self-pay | Admitting: Internal Medicine

## 2016-01-02 DIAGNOSIS — I4819 Other persistent atrial fibrillation: Secondary | ICD-10-CM

## 2016-01-04 ENCOUNTER — Emergency Department (HOSPITAL_COMMUNITY): Payer: BLUE CROSS/BLUE SHIELD

## 2016-01-04 ENCOUNTER — Other Ambulatory Visit: Payer: Self-pay

## 2016-01-04 ENCOUNTER — Ambulatory Visit (HOSPITAL_COMMUNITY)
Admission: RE | Admit: 2016-01-04 | Payer: BLUE CROSS/BLUE SHIELD | Source: Ambulatory Visit | Admitting: Internal Medicine

## 2016-01-04 ENCOUNTER — Encounter (HOSPITAL_COMMUNITY)
Admission: EM | Disposition: A | Discharge status: Home / Self Care | Payer: Self-pay | Source: Home / Self Care | Attending: Emergency Medicine

## 2016-01-04 ENCOUNTER — Emergency Department (HOSPITAL_COMMUNITY)
Admission: EM | Admit: 2016-01-04 | Discharge: 2016-01-04 | Disposition: A | Discharge status: Home / Self Care | Payer: BLUE CROSS/BLUE SHIELD | Attending: Emergency Medicine | Admitting: Emergency Medicine

## 2016-01-04 ENCOUNTER — Encounter (HOSPITAL_COMMUNITY): Payer: Self-pay | Admitting: *Deleted

## 2016-01-04 DIAGNOSIS — F17200 Nicotine dependence, unspecified, uncomplicated: Secondary | ICD-10-CM | POA: Insufficient documentation

## 2016-01-04 DIAGNOSIS — R42 Dizziness and giddiness: Secondary | ICD-10-CM | POA: Insufficient documentation

## 2016-01-04 DIAGNOSIS — Z79899 Other long term (current) drug therapy: Secondary | ICD-10-CM | POA: Insufficient documentation

## 2016-01-04 DIAGNOSIS — I481 Persistent atrial fibrillation: Secondary | ICD-10-CM | POA: Diagnosis not present

## 2016-01-04 DIAGNOSIS — I10 Essential (primary) hypertension: Secondary | ICD-10-CM | POA: Diagnosis not present

## 2016-01-04 DIAGNOSIS — R079 Chest pain, unspecified: Secondary | ICD-10-CM | POA: Diagnosis not present

## 2016-01-04 DIAGNOSIS — I4819 Other persistent atrial fibrillation: Secondary | ICD-10-CM

## 2016-01-04 DIAGNOSIS — R110 Nausea: Secondary | ICD-10-CM | POA: Diagnosis not present

## 2016-01-04 LAB — CBC
HCT: 43.1 % (ref 39.0–52.0)
Hemoglobin: 14.3 g/dL (ref 13.0–17.0)
MCH: 30.4 pg (ref 26.0–34.0)
MCHC: 33.2 g/dL (ref 30.0–36.0)
MCV: 91.7 fL (ref 78.0–100.0)
PLATELETS: 226 10*3/uL (ref 150–400)
RBC: 4.7 MIL/uL (ref 4.22–5.81)
RDW: 13.2 % (ref 11.5–15.5)
WBC: 6.2 10*3/uL (ref 4.0–10.5)

## 2016-01-04 LAB — BASIC METABOLIC PANEL
Anion gap: 14 (ref 5–15)
BUN: 15 mg/dL (ref 6–20)
CHLORIDE: 103 mmol/L (ref 101–111)
CO2: 23 mmol/L (ref 22–32)
CREATININE: 1.05 mg/dL (ref 0.61–1.24)
Calcium: 9.1 mg/dL (ref 8.9–10.3)
GFR calc Af Amer: >60 mL/min (ref >60)
GFR calc non Af Amer: >60 mL/min (ref >60)
GLUCOSE: 159 mg/dL — AB (ref 65–99)
Potassium: 4.1 mmol/L (ref 3.5–5.1)
Sodium: 140 mmol/L (ref 135–145)

## 2016-01-04 LAB — TROPONIN I
Troponin I: <0.03 ng/mL (ref <0.031)
Troponin I: <0.03 ng/mL (ref <0.031)

## 2016-01-04 SURGERY — CARDIOVERSION
Anesthesia: Monitor Anesthesia Care

## 2016-01-04 MED ORDER — MIDAZOLAM HCL 2 MG/2ML IJ SOLN
2.0000 mg | Freq: Once | INTRAMUSCULAR | Status: AC
  Administered 2016-01-04: 2 mg via INTRAVENOUS
  Filled 2016-01-04: qty 2

## 2016-01-04 MED ORDER — ETOMIDATE 2 MG/ML IV SOLN
0.1500 mg/kg | Freq: Once | INTRAVENOUS | Status: AC
  Administered 2016-01-04: 10 mg via INTRAVENOUS
  Filled 2016-01-04: qty 10

## 2016-01-04 NOTE — Consult Note (Addendum)
Cardiology Consult Note    Patient ID: Jacob Meadows MRN: [id number], DOB/AGE: [birth year]   Date of Consult: 01/04/2016   Primary Physician: Ezequiel Kayser, MD Primary Cardiologist: Dr. Graciela Husbands Reason for Consult: Chest Pain; Atrial Fibrillation Physician Requesting Consult: Dr. Donnald Garre   History of Present Illness    Jacob Meadows is a 65 y.o. male with past medical history of persistent atrial fibrillation and HTN who presents to [facility] ED on 01/04/2016 for evaluation of chest pain.  The patient reports he developed a chest pressure while in bed around midnight. He reported having a mild headache at that time and says he could tell his BP was elevated, although he did not have a BP cuff to check it. The pressure was located along the left pectoral region and was a 4/10 at its worse. The pain lasted approximately 3 hours and was completely resolved upon his arrival to the ED. Denies any associated dyspnea, diaphoresis, nausea, vomiting, or radiating pain. He denies any history of chest pain in the past. Says he did have mild dyspnea upon initially being diagnosed with atrial fibrillation but those symptoms have resolved since being started on Metoprolol and Flecainide.  He reports walking 3-5 miles per day without any chest discomfort or other anginal symptoms. No prior history of known CAD. He was initially diagnosed with atrial fibrillation in 11/2015 and underwent DCCV on 12/16/2015 following being on Eliquis for 3 weeks. He reverted back to atrial fibrillation following the cardioversion and was started on Flecainide by Dr. Graciela Husbands. He was suppose to undergo a second DCCV today, but developed the chest pain this morning which prompted him to come to the ED instead. He reports good compliance with his Flecainide, Eliquis, and Metoprolol. He has not yet started his Lisinopril due to being informed to start it following his cardioversion today.  While here, he has been in atrial  fibrillation, with his HR well-controlled in the 70's - 80's. Has been hypertensive with SBP in the 150's - low 200's. Initial troponin value negative. CBC and BMET with no acute abnormalities. EKG shows atrial fibrillation, HR 69, incomplete RBBB with no acute ST or T-wave changes. CXR shows no acute abnormalities.  Past Medical History   Past Medical History  Diagnosis Date  . Essential hypertension 11/24/2015  . Persistent atrial fibrillation (HCC) 11/24/2015  . Sleep-disordered breathing 11/24/2015  . Nonspecific abnormal electrocardiogram (ECG) low voltage limb leads 11/24/2015    Past Surgical History  Procedure Laterality Date  . Tonsillectomy      age 35  . Cardioversion N/A 12/16/2015    Procedure: CARDIOVERSION;  Surgeon: Chrystie Nose, MD;  Location: [facility] ENDOSCOPY;  Service: Cardiovascular;  Laterality: N/A;     Allergies: No Known Allergies   Home Medications    Prior to Admission medications   Medication Sig Start Date End Date Taking? Authorizing Provider  apixaban (ELIQUIS) 5 MG TABS tablet Take 1 tablet (5 mg total) by mouth 2 (two) times daily. 12/23/15  Yes Duke Salvia, MD  flecainide (TAMBOCOR) 100 MG tablet Take 1 tablet (100 mg total) by mouth 2 (two) times daily. 12/23/15  Yes Duke Salvia, MD  metoprolol (LOPRESSOR) 50 MG tablet Take 2 tablets (100 mg total) by mouth 2 (two) times daily. Increase dose as directed 12/23/15  Yes Duke Salvia, MD  losartan (COZAAR) 50 MG tablet Take 1 tablet (50 mg total) by mouth daily. 12/23/15   Duke Salvia, MD  Family History    Family History  Problem Relation Age of Onset  . Alzheimer's disease Mother   . Congestive Heart Failure Father   . Atrial fibrillation Father     Social History    Social History   Social History  . Marital Status: Married    Spouse Name: N/A  . Number of Children: N/A  . Years of Education: N/A   Occupational History  . Not on file.   Social History Main Topics  . Smoking status:  Current Some Day Smoker  . Smokeless tobacco: Never Used     Comment: 3 - 4 Cigars per week.  . Alcohol Use: 0.6 - 1.8 oz/week    1-3 Standard drinks or equivalent per week  . Drug Use: No  . Sexual Activity: Not on file   Other Topics Concern  . Not on file   Social History Narrative     Review of Systems    General:  No chills, fever, night sweats or weight changes.  Cardiovascular:  No  dyspnea on exertion, edema, orthopnea, palpitations, paroxysmal nocturnal dyspnea. Positive for chest pain. Dermatological: No rash, lesions/masses Respiratory: No cough, dyspnea Urologic: No hematuria, dysuria Abdominal:   No nausea, vomiting, diarrhea, bright red blood per rectum, melena, or hematemesis Neurologic:  No visual changes, wkns, changes in mental status. All other systems reviewed and are otherwise negative except as noted above.  Physical Exam    Blood pressure 157/94, pulse 67, temperature 97.9 F (36.6 C), resp. rate 15, SpO2 95 %.  General: Well developed, well nourished Caucasian,male appearing in no acute distress. Head: Normocephalic, atraumatic, sclera non-icteric, no xanthomas, nares are without discharge. Dentition:  Neck: No carotid bruits. JVD not elevated.  Lungs: Respirations regular and unlabored, without wheezes or rales.  Heart: Irregularly irregular. No S3 or S4.  No murmur, no rubs, or gallops appreciated. Abdomen: Soft, non-tender, non-distended with normoactive bowel sounds. No hepatomegaly. No rebound/guarding. No obvious abdominal masses. Msk:  Strength and tone appear normal for age. No joint deformities or effusions. Extremities: No clubbing or cyanosis. No edema.  Distal pedal pulses are 2+ bilaterally. Neuro: Alert and oriented X 3. Moves all extremities spontaneously. No focal deficits noted. Psych:  Responds to questions appropriately with a normal affect. Skin: No rashes or lesions noted  Labs    Troponin (Point of Care Test) No results for  input(s): TROPIPOC in the last 72 hours.  Recent Labs  01/04/16 0556  TROPONINI <0.03   Lab Results  Component Value Date   WBC 6.2 01/04/2016   HGB 14.3 01/04/2016   HCT 43.1 01/04/2016   MCV 91.7 01/04/2016   PLT 226 01/04/2016     Recent Labs Lab 01/04/16 0556  NA 140  K 4.1  CL 103  CO2 23  BUN 15  CREATININE 1.05  CALCIUM 9.1  GLUCOSE 159*     Radiology Studies    Dg Chest 2 View: 01/04/2016  CLINICAL DATA:  Mid to left-sided chest pain.  High blood pressure. EXAM: CHEST  2 VIEW COMPARISON:  11/24/2015 FINDINGS: Borderline heart size with normal pulmonary vascularity. No focal airspace disease or consolidation in the lungs. No blunting of costophrenic angles. No pneumothorax. Mediastinal contours appear intact. Degenerative changes in the spine and shoulders. IMPRESSION: No active cardiopulmonary disease. Electronically Signed   By: Burman Nieves M.D.   On: 01/04/2016 06:40    EKG & Cardiac Imaging    EKG:  Atrial fibrillation, HR 69, incomplete RBBB with  no acute ST or T-wave changes.  ECHOCARDIOGRAM: 11/27/2015 Study Conclusions - Left ventricle: The cavity size was normal. Wall thickness was increased in a pattern of mild LVH. Systolic function was normal. The estimated ejection fraction was in the range of 55% to 60%. Wall motion was normal; there were no regional wall motion abnormalities. - Left atrium: The atrium was mildly dilated.  Assessment & Plan   1. Persistent Atrial Fibrillation - diagnosed in 11/2015 with failed DCCV on 12/16/2015. Started on Flecainide and was scheduled for repeat DCCV today.  - This patients CHA2DS2-VASc Score and unadjusted Ischemic Stroke Rate (% per year) is equal to 2.2 % stroke rate/year from a score of 2 (HTN, Age). Continue Eliquis for anticoagulation.  - discussed the patient with Dr. Royann Shivers. Will plan for DCCV while in the ED.  - continue Flecainide and BB following DCCV.  2. Chest Pain at Rest -  developed chest pressure while at rest around midnight. Lasted 3+ hours and resolved spontaneously. - reports feeling like his BP was elevated at that time with a mild headache.  - has been ambulating 3-5 miles per day without any chest discomfort or anginal symptoms. - initial troponin negative and EKG without acute ischemic changes.  - will recheck delta troponin. If negative, will likely be stable for d/c from the ED by a Cardiology perspective.  3. HTN - BNP has been elevated in the 150's - low-200's while admitted - was on Lopressor 100mg  BID prior to admission. Had not yet started Losartan 50mg  daily.  - recommended he start Losartan following discharge. He does not own a BP cuff as of now but I advised him to purchase one so he can better monitor his BP at home.  Will arrange for close outpatient Cardiology follow-up with one of the Electrophysiology APP's next week following his DCCV today.  Signed, Ellsworth Lennox, PA-C 01/04/2016, 10:16 AM Pager: [phone]  I have seen and examined the patient along with Ellsworth Lennox, PA-C.  I have reviewed the chart, notes and new data.  I agree with PA/NP's note.  Key new complaints: felt poorly overnight, but currently asymptomaticc Key examination changes: atrial fibrillation, rate controlled Key new findings / data: normal labs  PLAN: Accelerated HTN, symptomatic. Persistent atrial fibrillation (ERAF after DCCV 2 weeks ago, now on flecainide).  Will proceed with DCCV in ED and start the losartan that was prescribed, but not yet started.  Thurmon Fair, MD, [facility] [phone] 01/04/2016, 10:54 AM   Addendum: Cardioverted after one shock to NSR 70 bpm. No complications. Will arrange early f/u for BP meds titration and ECG after DCCV.  Thurmon Fair, MD, [facility] [phone] office [phone] pager

## 2016-01-04 NOTE — ED Notes (Signed)
The pt is supposed to have a procedure today for his af.  He started feeling strange in his chest that woke him up

## 2016-01-04 NOTE — ED Provider Notes (Addendum)
Procedural sedation Performed by: Arby Barrette Consent: Verbal consent obtained. Risks and benefits: risks, benefits and alternatives were discussed Required items: required blood products, implants, devices, and special equipment available Patient identity confirmed: arm band and provided demographic data Time out: Immediately prior to procedure a "time out" was called to verify the correct patient, procedure, equipment, support staff and site/side marked as required.  Sedation type: moderate (conscious) sedation NPO time confirmed and considedered  Sedatives: ETOMIDATE  Physician Time at Bedside: 20 minutes  Vitals: Vital signs were monitored during sedation. Cardiac Monitor, pulse oximeter Patient tolerance: Patient tolerated the procedure well with no immediate complications. Comments: Pt with uneventful recovered. Returned to pre-procedural sedation baseline  Arby Barrette, MD 01/04/16 1206  .Sedation Date/Time: 01/04/2016 12:42 PM Performed by: Arby Barrette Authorized by: Arby Barrette  Consent:    Consent obtained:  Verbal and written   Consent given by:  Patient   Risks discussed:  Allergic reaction, prolonged hypoxia resulting in organ damage, prolonged sedation necessitating reversal, respiratory compromise necessitating ventilatory assistance and intubation and inadequate sedation Indications:    Sedation purpose:  Cardioversion   Procedure necessitating sedation performed by:  Different physician   Intended level of sedation:  Moderate (conscious sedation) Pre-sedation assessment:    ASA classification: class 2 - patient with mild systemic disease     Neck mobility: normal     Mouth opening:  3 or more finger widths   Thyromental distance:  4 finger widths   Mallampati score:  II - soft palate, uvula, fauces visible   Pre-sedation assessments completed and reviewed: airway patency, cardiovascular function, hydration status, mental status, nausea/vomiting,  pain level, respiratory function and temperature     Pre-sedation assessment completed:  01/04/2016 11:20 AM Immediate pre-procedure details:    Reviewed: vital signs     Verified: bag valve mask available, emergency equipment available, intubation equipment available, IV patency confirmed, oxygen available and suction available   Procedure details (see MAR for exact dosages):    Sedation start time:  01/04/2016 11:30 AM   Preoxygenation:  Room air   Sedation:  Etomidate   Intra-procedure monitoring:  Blood pressure monitoring, cardiac monitor, continuous capnometry, continuous pulse oximetry, frequent LOC assessments and frequent vital sign checks   Intra-procedure events: none     Sedation end time:  01/04/2016 12:15 PM Post-procedure details:    Post-sedation assessment completed:  01/04/2016 12:46 PM   Attendance: Constant attendance by certified staff until patient recovered     Recovery: Patient returned to pre-procedure baseline     Post-sedation assessments completed and reviewed: airway patency, cardiovascular function, hydration status, mental status, nausea/vomiting, pain level, respiratory function and temperature     Patient is stable for discharge or admission: Yes     Patient tolerance:  Tolerated well, no immediate complications   Arby Barrette, MD 01/04/16 1246

## 2016-01-04 NOTE — Discharge Instructions (Signed)
1. Medications: continue flecainide, continue metoprolol milligrams twice per day, start losartan 50 mg per day., usual home medications 2. Treatment: rest, drink plenty of fluids,  3. Follow Up: Please followup with Cardiology as dicussed for further evaluation after today's visit; if you do not have a primary care doctor use the resource guide provided to find one; Please return to the ER for return of symptoms or other concerns

## 2016-01-04 NOTE — CV Procedure (Signed)
Procedure: Electrical Cardioversion Indications:  Atrial Fibrillation  Procedure Details:  Consent: Risks of procedure as well as the alternatives and risks of each were explained to the (patient/caregiver).  Consent for procedure obtained.  Time Out: Verified patient identification, verified procedure, site/side was marked, verified correct patient position, special equipment/implants available, medications/allergies/relevent history reviewed, required imaging and test results available.  Performed  Patient placed on cardiac monitor, pulse oximetry, supplemental oxygen as necessary.  Sedation given: Etomidate, Dr. Donnald Garre Pacer pads placed anterior and posterior chest.  Cardioverted 1 time(s).  Cardioversion with synchronized biphasic 120J shock.  Evaluation: Findings: Post procedure EKG shows: NSR Complications: None Patient did tolerate procedure well.  Time Spent Directly with the Patient:  30 minutes   Monike Bragdon 01/04/2016, 10:52 AM

## 2016-01-04 NOTE — ED Notes (Signed)
The pt has been in af for a  Few days  He is supposed to

## 2016-01-04 NOTE — ED Provider Notes (Signed)
CSN: [id number]     Arrival date & time 01/04/16  4098 History   First MD Initiated Contact with Patient 01/04/16 [phone]     Chief Complaint  Patient presents with  . Chest Pain     (Consider location/radiation/quality/duration/timing/severity/associated sxs/prior Treatment) The history is provided by the patient and medical records. No language interpreter was used.     Jacob Meadows is a 65 y.o. male  with a hx of HTN, a-fib presents to the Emergency Department complaining of sudden onset, persistent chest discomfort waking him from sleep at 11:30 PM. He reports associated nausea and lightheadedness. She also reports over the last week he's had persistent nausea and believes some weight gain.  Patient reports a history of persistent A. fib. He is anticoagulated on eliquis and approximately 1.5 weeks ago was started on flecainide by his cardiologist Dr. Graciela Husbands. Patient reports that A. fib and hypertension were diagnosed in December 2016 with a previous cardioversion on 12/16/15, but returned to a-fib several days later.  He is scheduled for a cardioversion at 10 AM this morning by Dr. Tenny Craw.  He denies a history of MI or CAD.  She denies diaphoresis, syncope, vomiting, numbness, weakness, vision changes, SOB.  He reports he is able to walk 3-5 miles without chest pain or SOB.     Past Medical History  Diagnosis Date  . Essential hypertension 11/24/2015  . Persistent atrial fibrillation (HCC) 11/24/2015  . Sleep-disordered breathing 11/24/2015  . Nonspecific abnormal electrocardiogram (ECG) low voltage limb leads 11/24/2015   Past Surgical History  Procedure Laterality Date  . Tonsillectomy      age 27  . Cardioversion N/A 12/16/2015    Procedure: CARDIOVERSION;  Surgeon: Chrystie Nose, MD;  Location: [facility] ENDOSCOPY;  Service: Cardiovascular;  Laterality: N/A;   Family History  Problem Relation Age of Onset  . Alzheimer's disease Mother   . Congestive Heart Failure Father   . Atrial fibrillation  Father    Social History  Substance Use Topics  . Smoking status: Current Some Day Smoker  . Smokeless tobacco: Never Used     Comment: 3 - 4 Cigars per week.  . Alcohol Use: 0.6 - 1.8 oz/week    1-3 Standard drinks or equivalent per week    Review of Systems  Constitutional: Negative for fever, diaphoresis, appetite change, fatigue and unexpected weight change.  HENT: Negative for mouth sores.   Eyes: Negative for visual disturbance.  Respiratory: Negative for cough, chest tightness, shortness of breath and wheezing.   Cardiovascular: Positive for chest pain (discomfort).  Gastrointestinal: Positive for nausea. Negative for vomiting, abdominal pain, diarrhea and constipation.  Endocrine: Negative for polydipsia, polyphagia and polyuria.  Genitourinary: Negative for dysuria, urgency, frequency and hematuria.  Musculoskeletal: Negative for back pain and neck stiffness.  Skin: Negative for rash.  Allergic/Immunologic: Negative for immunocompromised state.  Neurological: Positive for light-headedness. Negative for syncope and headaches.  Hematological: Does not bruise/bleed easily.  Psychiatric/Behavioral: Negative for sleep disturbance. The patient is not nervous/anxious.       Allergies  Review of patient's allergies indicates no known allergies.  Home Medications   Prior to Admission medications   Medication Sig Start Date End Date Taking? Authorizing Provider  apixaban (ELIQUIS) 5 MG TABS tablet Take 1 tablet (5 mg total) by mouth 2 (two) times daily. 12/23/15  Yes Duke Salvia, MD  flecainide (TAMBOCOR) 100 MG tablet Take 1 tablet (100 mg total) by mouth 2 (two) times daily. 12/23/15  Yes  Duke Salvia, MD  metoprolol (LOPRESSOR) 50 MG tablet Take 2 tablets (100 mg total) by mouth 2 (two) times daily. Increase dose as directed 12/23/15  Yes Duke Salvia, MD  losartan (COZAAR) 50 MG tablet Take 1 tablet (50 mg total) by mouth daily. 12/23/15   Duke Salvia, MD   BP 163/88  mmHg  Pulse 78  Temp(Src) 97.9 F (36.6 C)  Resp 19  SpO2 100% Physical Exam  Constitutional: He appears well-developed and well-nourished. No distress.  Awake, alert, nontoxic appearance  HENT:  Head: Normocephalic and atraumatic.  Mouth/Throat: Oropharynx is clear and moist. No oropharyngeal exudate.  Eyes: Conjunctivae are normal. No scleral icterus.  Neck: Normal range of motion. Neck supple.  Cardiovascular: Normal rate, S1 normal, S2 normal and intact distal pulses.  An irregularly irregular rhythm present.  Pulses:      Radial pulses are 2+ on the right side, and 2+ on the left side.       Dorsalis pedis pulses are 2+ on the right side, and 2+ on the left side.  Pulmonary/Chest: Effort normal and breath sounds normal. No respiratory distress. He has no wheezes.  Equal chest expansion  Abdominal: Soft. Bowel sounds are normal. He exhibits no mass. There is no tenderness. There is no rebound and no guarding.  Musculoskeletal: Normal range of motion. He exhibits no edema.  Neurological: He is alert.  Speech is clear and goal oriented Moves extremities without ataxia  Skin: Skin is warm and dry. He is not diaphoretic.  Psychiatric: He has a normal mood and affect.  Nursing note and vitals reviewed.   ED Course  Procedures (including critical care time) Labs Review Labs Reviewed  BASIC METABOLIC PANEL - Abnormal; Notable for the following:    Glucose, Bld 159 (*)    All other components within normal limits  CBC  TROPONIN I  TROPONIN I    Imaging Review Dg Chest 2 View  01/04/2016  CLINICAL DATA:  Mid to left-sided chest pain.  High blood pressure. EXAM: CHEST  2 VIEW COMPARISON:  11/24/2015 FINDINGS: Borderline heart size with normal pulmonary vascularity. No focal airspace disease or consolidation in the lungs. No blunting of costophrenic angles. No pneumothorax. Mediastinal contours appear intact. Degenerative changes in the spine and shoulders. IMPRESSION: No active  cardiopulmonary disease. Electronically Signed   By: Burman Nieves M.D.   On: 01/04/2016 06:40   I have personally reviewed and evaluated these images and lab results as part of my medical decision-making.   EKG Interpretation   Date/Time:  Monday January 04 2016 05:34:04 EST Ventricular Rate:  69 PR Interval:    QRS Duration: 94 QT Interval:  406 QTC Calculation: 435 R Axis:   18 Text Interpretation:  Atrial fibrillation Incomplete right bundle branch  block Possible Anteroseptal infarct , age undetermined Abnormal ECG agree.  no acute ischemic changes Confirmed by Donnald Garre, MD, Lebron Conners [phone]) on  01/04/2016 7:52:50 AM     Post Cardioversion ECG:  EKG Interpretation  Date/Time:  Monday January 04 2016 10:51:17 EST Ventricular Rate:  70 PR Interval:  257 QRS Duration: 106 QT Interval:  418 QTC Calculation: 451 R Axis:   45 Text Interpretation:  Sinus rhythm Prolonged PR interval Low voltage, extremity and precordial leads agree. Confirmed by Donnald Garre, MD, Lebron Conners [phone]) on 01/04/2016 12:48:23 PM       CRITICAL CARE Performed by: Dierdre Forth Total critical care time: 50 minutes Critical care time was exclusive of separately billable procedures  and treating other patients. Critical care was necessary to treat or prevent imminent or life-threatening deterioration. Critical care was time spent personally by me on the following activities: development of treatment plan with patient and/or surrogate as well as nursing, discussions with consultants, evaluation of patient's response to treatment, examination of patient, obtaining history from patient or surrogate, ordering and performing treatments and interventions, ordering and review of laboratory studies, ordering and review of radiographic studies, pulse oximetry and re-evaluation of patient's condition.   .pro MDM   Final diagnoses:  Persistent atrial fibrillation (HCC)  Essential hypertension  Chest pain,  unspecified chest pain type   Jacob Meadows presents with hx of afib, CP and "feeling unwell" with acute worsening at 11:30pm last night. Pt was scheduled for cardioversion at 10am with Dr. Tenny Craw.  EKG with A. fib but no RVR. Initial labs reassuring. Initial troponin negative.  8:40 AM Discussed with Cardiology who will evaluate.     11:00 AM Cardioversion in ED complete with NSR and without complication.  Sedation performed with Dr. Donnald Garre at bedside.   12:43 PM Patient eating, alert and oriented, ambulatory without difficulty.  He is to follow with cardiology as directed.  Instructions to return immediately to the emergency department if symptoms return.   Jacob Client Dhwani Venkatesh, PA-C 01/04/16 1191  Arby Barrette, MD 01/06/16 1037

## 2016-01-04 NOTE — Sedation Documentation (Signed)
Pt cardioverted at 120J 

## 2016-01-05 ENCOUNTER — Other Ambulatory Visit: Payer: Self-pay | Admitting: *Deleted

## 2016-01-05 DIAGNOSIS — I480 Paroxysmal atrial fibrillation: Secondary | ICD-10-CM

## 2016-01-14 ENCOUNTER — Telehealth (HOSPITAL_COMMUNITY): Payer: Self-pay | Admitting: *Deleted

## 2016-01-14 NOTE — Progress Notes (Signed)
Cardiology Office Note Date:  01/15/2016  Patient ID:  Jacob Meadows 1951-11-20, MRN [id number] PCP:  Ezequiel Kayser, MD  Electrophysiologist:  Dr. Graciela Husbands   Chief Complaint: post ER visit DCCV  History of Present Illness: Jacob Meadows is a 65 y.o. male with history of Paroxysmal AFib, HTN, ?OSA pending sleep study, comes to the office today seen for Dr. Graciela Husbands.  He was at [facility] ER last week with c/o chest discomfort and noted to be in AFib though with CVR, and HTN with BP 150's-200 range, he was cardioverted in the ED, observed and eventually discharged from the ER with 2 negative trop and SR, BMET/CBC, CXR were unremarkable, The patient states he was told to increase his losartan to  daily, so he has been taking  BID, which he has been taking since last week.   He states that he woke with significant pressure in his head primarily, his impression as that his BP was high, though after a couple hours felt like he also did feel an uncomfortable feeling in his chest and went to the ER.  Record notes he was observed to have SBP as high the low 200's.    Since the ER visit he is feeling very well, no ongoing c/o of chest discomfort of any kind, no palpitations or SOB, no near syncope or syncope.   He is scheduled for sleep study for next month, but did resume using breath-right strips and thinks this has helped him, he is schedule for his stress test next week.  He denies any bleeding/signs of bleeding with the Eliquis, discussed bleeding precautions and signs of bleeding with him, he mentions he tended to bleed more when he cut himself shaving and recommended he use an electric shaver rather then a bladed razor.  AFib hx: First diagnosed Jan 2017, DCCV 12/16/15, lopressor and Eliquis Again in AF 12/23/15 started on Flecainide 01/04/16 DCCV   Past Medical History  Diagnosis Date  . Essential hypertension 11/24/2015  . Persistent atrial fibrillation (HCC) 11/24/2015  . Sleep-disordered  breathing 11/24/2015  . Nonspecific abnormal electrocardiogram (ECG) low voltage limb leads 11/24/2015    Past Surgical History  Procedure Laterality Date  . Tonsillectomy      age 67  . Cardioversion N/A 12/16/2015    Procedure: CARDIOVERSION;  Surgeon: Chrystie Nose, MD;  Location: [facility] ENDOSCOPY;  Service: Cardiovascular;  Laterality: N/A;    Current Outpatient Prescriptions  Medication Sig Dispense Refill  . apixaban (ELIQUIS) 5 MG TABS tablet Take 1 tablet (5 mg total) by mouth 2 (two) times daily. 60 tablet 6  . flecainide (TAMBOCOR) 100 MG tablet Take 1 tablet (100 mg total) by mouth 2 (two) times daily. 60 tablet 6  . losartan (COZAAR) 50 MG tablet Take 1 tablet (50 mg total) by mouth daily. (Patient taking differently: Take 50 mg by mouth 2 (two) times daily. ) 30 tablet 6  . metoprolol (LOPRESSOR) 50 MG tablet Take 2 tablets (100 mg total) by mouth 2 (two) times daily. Increase dose as directed 120 tablet 6   No current facility-administered medications for this visit.    Allergies:   Review of patient's allergies indicates no known allergies.   Social History:  The patient  reports that he has been smoking.  He has never used smokeless tobacco. He reports that he drinks about 0.6 - 1.8 oz of alcohol per week. He reports that he does not use illicit drugs.   Family History:  The  patient's family history includes Alzheimer's disease in his mother; Atrial fibrillation in his father; Congestive Heart Failure in his father.  ROS:  Please see the history of present illness.  All other systems are reviewed and otherwise negative.   PHYSICAL EXAM:  VS:  BP 138/92 mmHg  Pulse 63  Ht 5' 9.75" (1.772 m)  Wt 191 lb (86.637 kg)  BMI 27.59 kg/m2 BMI: Body mass index is 27.59 kg/(m^2). Well nourished, well developed, in no acute distress HEENT: normocephalic, atraumatic Neck: no JVD, carotid bruits or masses Cardiac:  normal S1, S2; RRR; no significant murmurs, no rubs, or  gallops Lungs:  clear to auscultation bilaterally, no wheezing, rhonchi or rales Abd: soft, nontender, MS: no deformity or atrophy Ext: no edema Skin: warm and dry, no rash Neuro:  No gross deficits appreciated Psych: euthymic mood, full affect   EKG:  Done today shows SR, 1st degree AVBlock, (PR , QRS , QTc ) appears similar to previous, no ischemic changes   11/27/15: Echocardiogram Study Conclusions - Left ventricle: The cavity size was normal. Wall thickness was increased in a pattern of mild LVH. Systolic function was normal. The estimated ejection fraction was in the range of 55% to 60%. Wall motion was normal; there were no regional wall motion abnormalities. - Left atrium: The atrium was mildly dilated. (39mm)  Recent Labs: 11/24/2015: ALT 44; Magnesium 1.8; TSH 1.753 01/04/2016: BUN 15; Creatinine, Ser 1.05; Hemoglobin 14.3; Platelets 226; Potassium 4.1; Sodium 140  No results found for requested labs within last 365 days.   Estimated Creatinine Clearance: 70.9 mL/min (by C-G formula based on Cr of 1.05).   Wt Readings from Last 3 Encounters:  01/15/16 191 lb (86.637 kg)  12/23/15 189 lb 2 oz (85.787 kg)  12/16/15 182 lb (82.555 kg)     Other studies reviewed: Additional studies/records reviewed today include: summarized above  ASSESSMENT AND PLAN:  1. Paroxysmal AFib      CHA2DS2vasc is at least 2 on Eliquis     Flecainide and metoprolol  2. HTN     Monitor on the increase dose of losartan     He will invest in a home BP cuff and monitor his BP, both for control and for any low readings on the increased dose of losartan.  3. CP ?    not an ongoing complaint    pending stress test next week  4. ?OSA     pending sleep study, scheduled for next month  Disposition: F/u with his stress test and sleep study, will have him f/u with the AF clinic as previously planned in 1 month, sooner if needed.  Current medicines are reviewed at length  with the patient today.  The patient did not have any concerns regarding medicines.  Judith Blonder, PA-C 01/15/2016 11:11 AM     [facility] [address] [place] [address] [phone] (office)  [phone] (fax)

## 2016-01-14 NOTE — Telephone Encounter (Signed)
Attempted to call patient regarding upcoming appointment- no answer. Rafferty Postlewait J Renesha Lizama, RN 

## 2016-01-15 ENCOUNTER — Ambulatory Visit (INDEPENDENT_AMBULATORY_CARE_PROVIDER_SITE_OTHER): Payer: BLUE CROSS/BLUE SHIELD | Admitting: Physician Assistant

## 2016-01-15 ENCOUNTER — Encounter: Payer: Self-pay | Admitting: Physician Assistant

## 2016-01-15 ENCOUNTER — Other Ambulatory Visit: Payer: Self-pay | Admitting: *Deleted

## 2016-01-15 VITALS — BP 138/92 | HR 63 | Ht 69.75 in | Wt 191.0 lb

## 2016-01-15 DIAGNOSIS — I10 Essential (primary) hypertension: Secondary | ICD-10-CM

## 2016-01-15 DIAGNOSIS — I4891 Unspecified atrial fibrillation: Secondary | ICD-10-CM

## 2016-01-15 MED ORDER — LOSARTAN POTASSIUM 100 MG PO TABS: 100.0000 mg | ORAL_TABLET | Freq: Every day | ORAL | Status: DC

## 2016-01-15 NOTE — Patient Instructions (Signed)
Medication Instructions:   Your physician recommends that you continue on your current medications as directed. Please refer to the Current Medication list given to you today.   If you need a refill on your cardiac medications before your next appointment, please call your pharmacy.  Labwork: NONE ORDER TODAY   Testing/Procedures:  NONE ORDER TODAY   Follow-Up:   CONTACT AFIB CLINIC AT [phone] MONDAY  TO BE SCHEDULED FOR AN APPT    Any Other Special Instructions Will Be Listed Below (If Applicable).

## 2016-01-18 ENCOUNTER — Telehealth (HOSPITAL_COMMUNITY): Payer: Self-pay | Admitting: *Deleted

## 2016-01-18 NOTE — Telephone Encounter (Signed)
Attempted to call patient regarding upcoming appointment- no answer. Lavell Ridings J Yahmir Sokolov, RN 

## 2016-01-19 ENCOUNTER — Ambulatory Visit (HOSPITAL_COMMUNITY): Payer: BLUE CROSS/BLUE SHIELD

## 2016-01-19 ENCOUNTER — Other Ambulatory Visit: Payer: BLUE CROSS/BLUE SHIELD

## 2016-01-26 ENCOUNTER — Telehealth (HOSPITAL_COMMUNITY): Payer: Self-pay | Admitting: *Deleted

## 2016-01-26 NOTE — Telephone Encounter (Signed)
Patient given detailed instructions per Myocardial Perfusion Study Information Sheet for the test on 01/28/16 Patient notified to arrive 15 minutes early and that it is imperative to arrive on time for appointment to keep from having the test rescheduled.  If you need to cancel or reschedule your appointment, please call the office within 24 hours of your appointment. Failure to do so may result in a cancellation of your appointment, and a $50 no show fee. Patient verbalized understanding.Modine Oppenheimer J Daveion Robar, RN  

## 2016-01-27 ENCOUNTER — Telehealth: Payer: Self-pay | Admitting: Internal Medicine

## 2016-01-27 NOTE — Telephone Encounter (Signed)
New message     Pt is scheduled for a stress test for tomorrow.  Question about which medications to take and not take for tomorrow

## 2016-01-27 NOTE — Telephone Encounter (Signed)
Follow Up:  Pt is calling back in stating that he no longer has any questions about his medications Herbert SetaHeather does not need to call him back

## 2016-01-28 ENCOUNTER — Ambulatory Visit (HOSPITAL_COMMUNITY): Payer: BLUE CROSS/BLUE SHIELD | Attending: Cardiovascular Disease

## 2016-01-28 ENCOUNTER — Other Ambulatory Visit (INDEPENDENT_AMBULATORY_CARE_PROVIDER_SITE_OTHER): Payer: BLUE CROSS/BLUE SHIELD | Admitting: *Deleted

## 2016-01-28 DIAGNOSIS — I481 Persistent atrial fibrillation: Secondary | ICD-10-CM | POA: Diagnosis not present

## 2016-01-28 DIAGNOSIS — I480 Paroxysmal atrial fibrillation: Secondary | ICD-10-CM | POA: Diagnosis not present

## 2016-01-28 DIAGNOSIS — R079 Chest pain, unspecified: Secondary | ICD-10-CM | POA: Diagnosis not present

## 2016-01-28 DIAGNOSIS — I10 Essential (primary) hypertension: Secondary | ICD-10-CM | POA: Insufficient documentation

## 2016-01-28 DIAGNOSIS — I4819 Other persistent atrial fibrillation: Secondary | ICD-10-CM

## 2016-01-28 LAB — MYOCARDIAL PERFUSION IMAGING
CHL CUP NUCLEAR SSS: 1
CSEPPHR: 106 {beats}/min
LV dias vol: 140 mL (ref 62–150)
LVSYSVOL: 75 mL
NUC STRESS TID: 1.19
RATE: 0.31
Rest HR: 75 {beats}/min
SDS: 0
SRS: 1

## 2016-01-28 LAB — BASIC METABOLIC PANEL
BUN: 19 mg/dL (ref 7–25)
CALCIUM: 8.9 mg/dL (ref 8.6–10.3)
CO2: 27 mmol/L (ref 20–31)
CREATININE: 0.97 mg/dL (ref 0.70–1.25)
Chloride: 103 mmol/L (ref 98–110)
GLUCOSE: 122 mg/dL — AB (ref 65–99)
Potassium: 4.3 mmol/L (ref 3.5–5.3)
SODIUM: 140 mmol/L (ref 135–146)

## 2016-01-28 MED ORDER — REGADENOSON 0.4 MG/5ML IV SOLN
0.4000 mg | Freq: Once | INTRAVENOUS | Status: AC
  Administered 2016-01-28: 0.4 mg via INTRAVENOUS

## 2016-01-28 MED ORDER — TECHNETIUM TC 99M SESTAMIBI GENERIC - CARDIOLITE
10.2000 | Freq: Once | INTRAVENOUS | Status: AC | PRN
  Administered 2016-01-28: 10 via INTRAVENOUS

## 2016-01-28 MED ORDER — TECHNETIUM TC 99M SESTAMIBI GENERIC - CARDIOLITE
32.9000 | Freq: Once | INTRAVENOUS | Status: AC | PRN
  Administered 2016-01-28: 32.9 via INTRAVENOUS

## 2016-01-28 NOTE — Addendum Note (Signed)
Addended by: Breeonna Mone K on: 01/28/2016 07:57 AM   Modules accepted: Orders  

## 2016-01-28 NOTE — Addendum Note (Signed)
Addended by: Tonita PhoenixBOWDEN, Meiko Stranahan K on: 01/28/2016 07:57 AM   Modules accepted: Orders

## 2016-01-29 ENCOUNTER — Other Ambulatory Visit: Payer: Self-pay

## 2016-01-29 ENCOUNTER — Encounter (HOSPITAL_COMMUNITY): Payer: Self-pay | Admitting: Emergency Medicine

## 2016-01-29 ENCOUNTER — Encounter (HOSPITAL_BASED_OUTPATIENT_CLINIC_OR_DEPARTMENT_OTHER): Payer: BLUE CROSS/BLUE SHIELD

## 2016-01-29 ENCOUNTER — Observation Stay (HOSPITAL_COMMUNITY)
Admission: EM | Admit: 2016-01-29 | Discharge: 2016-01-30 | Disposition: A | Discharge status: Home / Self Care | Payer: BLUE CROSS/BLUE SHIELD | Attending: Cardiology | Admitting: Cardiology

## 2016-01-29 ENCOUNTER — Emergency Department (HOSPITAL_COMMUNITY): Payer: BLUE CROSS/BLUE SHIELD

## 2016-01-29 ENCOUNTER — Telehealth: Payer: Self-pay | Admitting: Internal Medicine

## 2016-01-29 DIAGNOSIS — Z7901 Long term (current) use of anticoagulants: Secondary | ICD-10-CM | POA: Insufficient documentation

## 2016-01-29 DIAGNOSIS — I483 Typical atrial flutter: Secondary | ICD-10-CM | POA: Diagnosis not present

## 2016-01-29 DIAGNOSIS — I10 Essential (primary) hypertension: Secondary | ICD-10-CM

## 2016-01-29 DIAGNOSIS — R0602 Shortness of breath: Secondary | ICD-10-CM | POA: Diagnosis not present

## 2016-01-29 DIAGNOSIS — F17290 Nicotine dependence, other tobacco product, uncomplicated: Secondary | ICD-10-CM | POA: Diagnosis not present

## 2016-01-29 DIAGNOSIS — I484 Atypical atrial flutter: Secondary | ICD-10-CM | POA: Diagnosis not present

## 2016-01-29 DIAGNOSIS — R079 Chest pain, unspecified: Secondary | ICD-10-CM | POA: Diagnosis not present

## 2016-01-29 DIAGNOSIS — I481 Persistent atrial fibrillation: Secondary | ICD-10-CM | POA: Diagnosis not present

## 2016-01-29 DIAGNOSIS — I480 Paroxysmal atrial fibrillation: Secondary | ICD-10-CM | POA: Insufficient documentation

## 2016-01-29 DIAGNOSIS — Z79899 Other long term (current) drug therapy: Secondary | ICD-10-CM | POA: Diagnosis not present

## 2016-01-29 DIAGNOSIS — I4892 Unspecified atrial flutter: Secondary | ICD-10-CM | POA: Diagnosis not present

## 2016-01-29 HISTORY — DX: Unspecified atrial flutter: I48.92

## 2016-01-29 LAB — BASIC METABOLIC PANEL
Anion gap: 12 (ref 5–15)
BUN: 18 mg/dL (ref 6–20)
CALCIUM: 9.1 mg/dL (ref 8.9–10.3)
CO2: 22 mmol/L (ref 22–32)
CREATININE: 1.01 mg/dL (ref 0.61–1.24)
Chloride: 104 mmol/L (ref 101–111)
GFR calc non Af Amer: >60 mL/min (ref >60)
Glucose, Bld: 202 mg/dL — ABNORMAL HIGH (ref 65–99)
Potassium: 4.5 mmol/L (ref 3.5–5.1)
Sodium: 138 mmol/L (ref 135–145)

## 2016-01-29 LAB — CBC
HCT: 42.9 % (ref 39.0–52.0)
Hemoglobin: 14.4 g/dL (ref 13.0–17.0)
MCH: 30.3 pg (ref 26.0–34.0)
MCHC: 33.6 g/dL (ref 30.0–36.0)
MCV: 90.3 fL (ref 78.0–100.0)
PLATELETS: 243 10*3/uL (ref 150–400)
RBC: 4.75 MIL/uL (ref 4.22–5.81)
RDW: 13.5 % (ref 11.5–15.5)
WBC: 9.3 10*3/uL (ref 4.0–10.5)

## 2016-01-29 LAB — I-STAT TROPONIN, ED: TROPONIN I, POC: 0.01 ng/mL (ref 0.00–0.08)

## 2016-01-29 LAB — TROPONIN I: Troponin I: <0.03 ng/mL (ref <0.031)

## 2016-01-29 MED ORDER — ONDANSETRON HCL 4 MG/2ML IJ SOLN: 4.0000 mg | Freq: Four times a day (QID) | INTRAMUSCULAR | Status: DC | PRN

## 2016-01-29 MED ORDER — APIXABAN 5 MG PO TABS
5.0000 mg | ORAL_TABLET | Freq: Two times a day (BID) | ORAL | Status: DC
  Administered 2016-01-29 – 2016-01-30 (×2): 5 mg via ORAL
  Filled 2016-01-29 (×2): qty 1

## 2016-01-29 MED ORDER — AMIODARONE HCL 200 MG PO TABS
400.0000 mg | ORAL_TABLET | Freq: Two times a day (BID) | ORAL | Status: DC
  Administered 2016-01-29 – 2016-01-30 (×2): 400 mg via ORAL
  Filled 2016-01-29 (×2): qty 2

## 2016-01-29 MED ORDER — ACETAMINOPHEN 325 MG PO TABS: 650.0000 mg | ORAL_TABLET | ORAL | Status: DC | PRN

## 2016-01-29 MED ORDER — LOSARTAN POTASSIUM 50 MG PO TABS
100.0000 mg | ORAL_TABLET | Freq: Every day | ORAL | Status: DC
  Administered 2016-01-30: 100 mg via ORAL
  Filled 2016-01-29: qty 2

## 2016-01-29 MED ORDER — LABETALOL HCL 200 MG PO TABS
300.0000 mg | ORAL_TABLET | Freq: Two times a day (BID) | ORAL | Status: DC
  Administered 2016-01-29: 300 mg via ORAL
  Filled 2016-01-29: qty 1

## 2016-01-29 MED ORDER — HYDRALAZINE HCL 20 MG/ML IJ SOLN
10.0000 mg | INTRAMUSCULAR | Status: DC | PRN
  Administered 2016-01-29: 10 mg via INTRAVENOUS
  Filled 2016-01-29: qty 1

## 2016-01-29 NOTE — ED Notes (Signed)
Pt c/o left sided chest pain with shortness of breath and some diaphoresis at 0100 this morning.

## 2016-01-29 NOTE — Telephone Encounter (Signed)
New message      Calling to let Dr Graciela HusbandsKlein know that pt is on his way to [place] because of chest pain and sob.

## 2016-01-29 NOTE — ED Notes (Signed)
Attempted report rn will call back 

## 2016-01-29 NOTE — ED Notes (Signed)
Pt now reports 2/10 L sided non radiating chest pain. Respirations unlabored

## 2016-01-29 NOTE — ED Notes (Signed)
ED physician shown EKG requests repeat in 30 mins

## 2016-01-29 NOTE — Consult Note (Signed)
ELECTROPHYSIOLOGY CONSULT NOTE  Patient ID: Jacob Meadows, MRN: [id number], DOB/AGE: [birth year] 65 y.o. Admit date: 01/29/2016 Date of Consult: 01/29/2016  Primary Physician: Ezequiel Kayser, MD Primary Cardiologist: SK  Chief Complaint: Atrial flutter   HPI Jacob Meadows is a 65 y.o. male  Seen with recurrences of atrial arrhythmia associated with palpitations since being ill at ease. He was admitted this morning following awakening at night with chest discomfort some shortness of breath and again the sensibilities. Notably, he was in the hospital about 4 weeks ago and was cardioverted in the emergency room for a similar atrial flutter rhythm. It is his wife's impression that he was better following cardioversion.  When he was initially seen 11/24/15 tracings were clearly atrial fibrillation. We initiated flecainide therapy and he has had 2 visits to the emergency room with atrial flutter I clear proarrhythmic manifestation. On both occasions his blood pressure isn't significantly elevated.  Just 2 days ago however, he was walking 3 or 4 miles without difficulty. And 48 hours ago he underwent Lexiscan Myoview revealing a low risk scan. Initial troponin this morning is negative. Repeat orders were not taken off as he was "held for the floor" they're pending.  I reviewed the aforementioned with his wife as well  Sleep study was anticipated for tonight. It was canceled.  Dr. Jens Som is also raised the possibility of renovascular hypertension and has ordered renal Dopplers.     Past Medical History  Diagnosis Date  . Essential hypertension 11/24/2015  . Persistent atrial fibrillation (HCC) 11/24/2015  . Sleep-disordered breathing 11/24/2015  . Nonspecific abnormal electrocardiogram (ECG) low voltage limb leads 11/24/2015      Surgical History:  Past Surgical History  Procedure Laterality Date  . Tonsillectomy      age 20  . Cardioversion N/A 12/16/2015    Procedure: CARDIOVERSION;   Surgeon: Chrystie Nose, MD;  Location: [facility] ENDOSCOPY;  Service: Cardiovascular;  Laterality: N/A;     Home Meds: Prior to Admission medications   Medication Sig Start Date End Date Taking? Authorizing Provider  apixaban (ELIQUIS) 5 MG TABS tablet Take 1 tablet (5 mg total) by mouth 2 (two) times daily. 12/23/15  Yes Duke Salvia, MD  flecainide (TAMBOCOR) 100 MG tablet Take 1 tablet (100 mg total) by mouth 2 (two) times daily. 12/23/15  Yes Duke Salvia, MD  ibuprofen (ADVIL,MOTRIN) 200 MG tablet Take 200 mg by mouth every 6 (six) hours as needed for moderate pain.   Yes Historical Provider, MD  losartan (COZAAR) 100 MG tablet Take 1 tablet (100 mg total) by mouth daily. 01/15/16  Yes Renee Norberto Sorenson, PA-C  metoprolol (LOPRESSOR) 50 MG tablet Take 2 tablets (100 mg total) by mouth 2 (two) times daily. Increase dose as directed 12/23/15  Yes Duke Salvia, MD  Multiple Vitamins-Minerals (CENTRUM SILVER ADULT 50+ PO) Take 1 tablet by mouth daily.   Yes Historical Provider, MD      Allergies: No Known Allergies  Social History   Social History  . Marital Status: Married    Spouse Name: N/A  . Number of Children: N/A  . Years of Education: N/A   Occupational History  . Not on file.   Social History Main Topics  . Smoking status: Current Some Day Smoker  . Smokeless tobacco: Never Used     Comment: 3 - 4 Cigars per week.  . Alcohol Use: 0.6 - 1.8 oz/week    1-3 Standard drinks or equivalent per  week  . Drug Use: No  . Sexual Activity: Not on file   Other Topics Concern  . Not on file   Social History Narrative     Family History  Problem Relation Age of Onset  . Alzheimer's disease Mother   . Congestive Heart Failure Father   . Atrial fibrillation Father      ROS:  Please see the history of present illness.     All other systems reviewed and negative.    Physical Exam: Blood pressure 141/84, pulse 76, temperature 98.8 F (37.1 C), temperature source Oral, resp. rate  16, height 5\' 10"  (1.778 m), weight 192 lb 1 oz (87.119 kg), SpO2 98 %. General: Well developed, well nourished male in no acute distress. Head: Normocephalic, atraumatic, sclera non-icteric, no xanthomas, nares are without discharge. EENT: normal Lymph Nodes:  none Back: without scoliosis/kyphosis, no CVA tendersness Neck: Negative for carotid bruits. JVD not elevated. Lungs: Clear bilaterally to auscultation without wheezes, rales, or rhonchi. Breathing is unlabored. Heart: RRR with S1 S2 2/6  murmur , rubs, or gallops appreciated. Abdomen: Soft, non-tender, non-distended with normoactive bowel sounds. No hepatomegaly. No rebound/guarding. No obvious abdominal masses. Msk:  Strength and tone appear normal for age. Extremities: No clubbing or cyanosis. No  edema.  Distal pedal pulses are 2+ and equal bilaterally. Skin: Warm and Dry Neuro: Alert and oriented X 3. CN III-XII intact Grossly normal sensory and motor function . Psych:  Responds to questions appropriately with a normal affect.      Labs: Cardiac Enzymes No results for input(s): CKTOTAL, CKMB, TROPONINI in the last 72 hours. CBC Lab Results  Component Value Date   WBC 9.3 01/29/2016   HGB 14.4 01/29/2016   HCT 42.9 01/29/2016   MCV 90.3 01/29/2016   PLT 243 01/29/2016   PROTIME: No results for input(s): LABPROT, INR in the last 72 hours. Chemistry  Recent Labs Lab 01/29/16 0856  NA 138  K 4.5  CL 104  CO2 22  BUN 18  CREATININE 1.01  CALCIUM 9.1  GLUCOSE 202*   Lipids No results found for: CHOL, HDL, LDLCALC, TRIG BNP No results found for: PROBNP Thyroid Function Tests: No results for input(s): TSH, T4TOTAL, T3FREE, THYROIDAB in the last 72 hours.  Invalid input(s): FREET3    Miscellaneous No results found for: DDIMER  Radiology/Studies:  Dg Chest 2 View  01/04/2016  CLINICAL DATA:  Mid to left-sided chest pain.  High blood pressure. EXAM: CHEST  2 VIEW COMPARISON:  11/24/2015 FINDINGS:  Borderline heart size with normal pulmonary vascularity. No focal airspace disease or consolidation in the lungs. No blunting of costophrenic angles. No pneumothorax. Mediastinal contours appear intact. Degenerative changes in the spine and shoulders. IMPRESSION: No active cardiopulmonary disease. Electronically Signed   By: Burman NievesWilliam  Stevens M.D.   On: 01/04/2016 06:40   Dg Chest Portable 1 View  01/29/2016  CLINICAL DATA:  Atrial fibrillation with high blood pressure and history of hypertension EXAM: PORTABLE CHEST 1 VIEW COMPARISON:  01/04/2016 FINDINGS: Mild cardiac enlargement stable. Vascular pattern normal. Lungs clear. IMPRESSION: No acute findings Electronically Signed   By: Esperanza Heiraymond  Rubner M.D.   On: 01/29/2016 11:20    EKG: atrial flutter atypical neg Fklutter waves inferiorly and biphasic waves V1 CVR  No change from tachycardia ECG  01/04/16   Assessment and Plan:  Persistent atrial fibrillation  Persistent atrial flutter-atypical occurring in the context of flecainide therapy  Labile hypertension  Sleep disordered breathing    He  has had a proarrhythmic effect flecainide with recurrences of atrial flutter. Given his young age, I think a target for therapy should be catheter ablation. He is wife are agreeable to see either Dr. Fawn Kirk [facility] for consideration of this.  In the interim, we will try to restore sinus rhythm for symptom relief.  Drug options I think are dofetilide or amiodarone. I am not altogether sanguine that another 1C agent would be any better. The role of amiodarone in this young man is not great except as a short term option; he would prefer this as opposed to 3 days in hospital to initiate dofetilide.  Therefore we will plan to 1-discharge in a.m. 2-amiodarone 400 twice a day 1 week followed by 200 twice a day 2 weeks followed by 200 daily 3-anticipate cardioversion 3/20 as an outpatient 4-outpatient renal Dopplers 5-discontinue metoprolol and use labetalol for  augmented blood pressure control 6-follow-up with his sleep study      Sherryl Manges

## 2016-01-29 NOTE — ED Notes (Signed)
Pt resting quietly at the time. Vital signs stable. NAD 

## 2016-01-29 NOTE — ED Notes (Signed)
Pt alert no distress 

## 2016-01-29 NOTE — ED Provider Notes (Signed)
CSN: [id number]     Arrival date & time 01/29/16  0847 History   First MD Initiated Contact with Patient 01/29/16 [phone]     Chief Complaint  Patient presents with  . Shortness of Breath  . Chest Pain      HPI Patient awoke with a chest discomfort around 1:30 this morning.  Patient had a chemical stress test yesterday.  Results are unknown.  Has history of atrial fibrillation.  When they hooked him up to the stress test yesterday and told him he was in A. fib.  He did not know that prior.  He has had cardioversion twice for A. fib.  He is currently taking Eliquis and blood pressure medications. Past Medical History  Diagnosis Date  . Essential hypertension 11/24/2015  . Persistent atrial fibrillation (HCC) 11/24/2015  . Sleep-disordered breathing 11/24/2015  . Nonspecific abnormal electrocardiogram (ECG) low voltage limb leads 11/24/2015  . Atrial flutter (HCC)     "just the last week or so" (01/29/2016)   Past Surgical History  Procedure Laterality Date  . Cardioversion N/A 12/16/2015    Procedure: CARDIOVERSION;  Surgeon: Chrystie Nose, MD;  Location: [facility] ENDOSCOPY;  Service: Cardiovascular;  Laterality: N/A;  . Tonsillectomy  1957  . Cardioversion  12/2015    "done in ER"   Family History  Problem Relation Age of Onset  . Alzheimer's disease Mother   . Congestive Heart Failure Father   . Atrial fibrillation Father    Social History  Substance Use Topics  . Smoking status: Current Some Day Smoker -- 15 years    Types: Cigars  . Smokeless tobacco: Never Used  . Alcohol Use: 4.2 - 5.4 oz/week    1-3 Standard drinks or equivalent, 4 Glasses of wine, 2 Shots of liquor per week    Review of Systems  All other systems reviewed and are negative.     Allergies  Review of patient's allergies indicates no known allergies.  Home Medications   Prior to Admission medications   Medication Sig Start Date End Date Taking? Authorizing Provider  apixaban (ELIQUIS) 5 MG TABS tablet Take 1  tablet (5 mg total) by mouth 2 (two) times daily. 12/23/15  Yes Duke Salvia, MD  ibuprofen (ADVIL,MOTRIN) 200 MG tablet Take 200 mg by mouth every 6 (six) hours as needed for moderate pain.   Yes Historical Provider, MD  losartan (COZAAR) 100 MG tablet Take 1 tablet (100 mg total) by mouth daily. 01/15/16  Yes Renee Norberto Sorenson, PA-C  Multiple Vitamins-Minerals (CENTRUM SILVER ADULT 50+ PO) Take 1 tablet by mouth daily.   Yes Historical Provider, MD  amiodarone (PACERONE) 200 MG tablet Take 1-2 tablets (200-400 mg total) by mouth 2 (two) times daily. 2 tabs bid for 1 week, then 1 tab bid for 1 week, then 1 tab daily 01/30/16   Rhonda G Barrett, PA-C  carvedilol (COREG) 12.5 MG tablet Take 1 tablet (12.5 mg total) by mouth 2 (two) times daily. 02/01/16   Will Jorja Loa, MD  labetalol (NORMODYNE) 200 MG tablet Take 1 tablet (200 mg total) by mouth 2 (two) times daily. 01/30/16   Rhonda G Barrett, PA-C   BP 123/75 mmHg  Pulse 77  Temp(Src) 98.5 F (36.9 C) (Oral)  Resp 14  Ht  (1.778 m)  Wt 185 lb (83.915 kg)  BMI 26.54 kg/m2  SpO2 99% Physical Exam Physical Exam  Nursing note and vitals reviewed. Constitutional: He is oriented to person, place, and time. He  appears well-developed and well-nourished. No distress.  HENT:  Head: Normocephalic and atraumatic.  Eyes: Pupils are equal, round, and reactive to light.  Neck: Normal range of motion.  Cardiovascular: Normal rate and intact distal pulses.   Pulmonary/Chest: No respiratory distress.  Abdominal: Normal appearance. He exhibits no distension.  Musculoskeletal: Normal range of motion.  Neurological: He is alert and oriented to person, place, and time. No cranial nerve deficit.  Skin: Skin is warm and dry. No rash noted.  Psychiatric: He has a normal mood and affect. His behavior is normal.   ED Course  Procedures (including critical care time) Labs Review Labs Reviewed  BASIC METABOLIC PANEL - Abnormal; Notable for the  following:    Glucose, Bld 202 (*)    All other components within normal limits  BASIC METABOLIC PANEL - Abnormal; Notable for the following:    Glucose, Bld 146 (*)    All other components within normal limits  TROPONIN I - Abnormal; Notable for the following:    Troponin I 0.09 (*)    All other components within normal limits  HEPATIC FUNCTION PANEL - Abnormal; Notable for the following:    Total Protein 6.1 (*)    AST 12 (*)    All other components within normal limits  HEMOGLOBIN A1C - Abnormal; Notable for the following:    Hgb A1c MFr Bld 7.1 (*)    All other components within normal limits  CBC  CBC  TROPONIN I  TROPONIN I  TSH  I-STAT TROPOININ, ED    Imaging Review No results found. I have personally reviewed and evaluated these images and lab results as part of my medical decision-making.   EKG Interpretation   Date/Time:  Friday January 29 2016 08:51:57 EST Ventricular Rate:  68 PR Interval:    QRS Duration: 94 QT Interval:  396 QTC Calculation: 421 R Axis:   32 Text Interpretation:  Atrial flutter with variable A-V block Incomplete  right bundle branch block Possible Anteroseptal infarct , age undetermined  Abnormal ECG Confirmed by Garrus Gauthreaux  MD, Sebasthian Stailey [phone] on 01/29/2016 9:17:50  AM     Cardiology was contacted he will come see the patient emergency department.   MDM   Final diagnoses:  Atypical atrial flutter (HCC)        Nelva Nayobert Lesha Jager, MD 02/07/16 1815

## 2016-01-29 NOTE — Telephone Encounter (Signed)
Will forward to Dr. Graciela [facility]bandsKlein [facility]

## 2016-01-29 NOTE — ED Notes (Signed)
Report called  

## 2016-01-29 NOTE — ED Notes (Signed)
Pt resting quietly at the time. Remains on cardiac monitor. Vital signs stable. Pt and family updated on plan of care.

## 2016-01-29 NOTE — H&P (Signed)
History & Physical    Patient ID: FAITH BRANAN MRN: [id number], DOB/AGE: 1951/02/01   Admit date: 01/29/2016   Primary Physician: Ezequiel Kayser, MD Primary Cardiologist: Dr. Graciela Husbands   History of Present Illness    Jacob Meadows is a 65 y.o. male with past medical history of persistent atrial fibrillation and HTN who presents to [facility] ED on 01/29/2016 for evaluation of chest pain and shortness of breath.   The patient reports he awoke from sleep at 1:00AM with left pectoral region pain and was short of breath. His shortness of breath lasted for an hour then resolved. He reports still having a chest pressure that is a 2/10. Nothing makes it better or worse. No associated nausea, vomiting, or diaphoresis.   He had been doing well earlier this week and was walking 3-4 miles per day without difficulty. Yesterday, he had a nuclear stress test and was notified at that time he was back in atrial fibrillation/atrial flutter. He was asymptomatic at that time with  The rhythm. He was initially scheduled for a treadmill test but Lexiscan was performed instead due to his arrhythmia. His stress test showed no evidence of ischemia and was low-risk.  He was initially diagnosed with atrial fibrillation in 11/2015 and underwent DCCV on 12/16/2015 following being on Eliquis for 3 weeks. He reverted back to atrial fibrillation following the cardioversion and was started on Flecainide by Dr. Graciela Husbands. He presented to the ED on 01/04/2016 for chest pain and was found to be in atrial fibrillation again. He underwent successful DCCV with return to NSR. He was seen in the office on 01/14/2016 and was maintaining NSR at that time.  While here, he has been in atrial flutter, with his HR well-controlled in the 70's - 80's. Has been hypertensive with BP ranging from 170/110 - 180/140. Initial troponin value negative. CBC normal. Glucose 202. Electrolytes normal. CXR without acute abnormalities.   Past Medical History     Past Medical History  Diagnosis Date  . Essential hypertension 11/24/2015  . Persistent atrial fibrillation (HCC) 11/24/2015  . Sleep-disordered breathing 11/24/2015  . Nonspecific abnormal electrocardiogram (ECG) low voltage limb leads 11/24/2015    Past Surgical History  Procedure Laterality Date  . Tonsillectomy      age 41  . Cardioversion N/A 12/16/2015    Procedure: CARDIOVERSION;  Surgeon: Chrystie Nose, MD;  Location: [facility] ENDOSCOPY;  Service: Cardiovascular;  Laterality: N/A;     Allergies  No Known Allergies   Home Medications    Prior to Admission medications   Medication Sig Start Date End Date Taking? Authorizing Provider  apixaban (ELIQUIS) 5 MG TABS tablet Take 1 tablet (5 mg total) by mouth 2 (two) times daily. 12/23/15   Duke Salvia, MD  flecainide (TAMBOCOR) 100 MG tablet Take 1 tablet (100 mg total) by mouth 2 (two) times daily. 12/23/15   Duke Salvia, MD  losartan (COZAAR) 100 MG tablet Take 1 tablet (100 mg total) by mouth daily. 01/15/16   Renee Norberto Sorenson, PA-C  metoprolol (LOPRESSOR) 50 MG tablet Take 2 tablets (100 mg total) by mouth 2 (two) times daily. Increase dose as directed 12/23/15   Duke Salvia, MD    Family History    Family History  Problem Relation Age of Onset  . Alzheimer's disease Mother   . Congestive Heart Failure Father   . Atrial fibrillation Father     Social History    Social History  Social History  . Marital Status: Married    Spouse Name: N/A  . Number of Children: N/A  . Years of Education: N/A   Occupational History  . Not on file.   Social History Main Topics  . Smoking status: Current Some Day Smoker  . Smokeless tobacco: Never Used     Comment: 3 - 4 Cigars per week.  . Alcohol Use: 0.6 - 1.8 oz/week    1-3 Standard drinks or equivalent per week  . Drug Use: No  . Sexual Activity: Not on file   Other Topics Concern  . Not on file   Social History Narrative     Review of Systems    General:  No  chills, fever, night sweats or weight changes.  Cardiovascular:  No dyspnea on exertion, edema, orthopnea, palpitations, paroxysmal nocturnal dyspnea. Positive for chest pain and dyspnea at rest. Dermatological: No rash, lesions/masses Respiratory: No cough, dyspnea Urologic: No hematuria, dysuria Abdominal:   No nausea, vomiting, diarrhea, bright red blood per rectum, melena, or hematemesis Neurologic:  No visual changes, wkns, changes in mental status. All other systems reviewed and are otherwise negative except as noted above.  Physical Exam    Blood pressure 186/97, pulse 67, temperature 98 F (36.7 C), temperature source Oral, resp. rate 18, height 5\' 10"  (1.778 m), weight 192 lb 1 oz (87.119 kg), SpO2 100 %.  General: Well developed, well nourished,male appearing in no acute distress. Head: Normocephalic, atraumatic, sclera non-icteric, no xanthomas, nares are without discharge. Dentition:  Neck: No carotid bruits. JVD not elevated.  Lungs: Respirations regular and unlabored, without wheezes or rales.  Heart: Irregularly irregular. No S3 or S4.  No murmur, no rubs, or gallops appreciated. Abdomen: Soft, non-tender, non-distended with normoactive bowel sounds. No hepatomegaly. No rebound/guarding. No obvious abdominal masses. Msk:  Strength and tone appear normal for age. No joint deformities or effusions. Extremities: No clubbing or cyanosis. No edema.  Distal pedal pulses are 2+ bilaterally. Neuro: Alert and oriented X 3. Moves all extremities spontaneously. No focal deficits noted. Psych:  Responds to questions appropriately with a normal affect. Skin: No rashes or lesions noted  Labs    Troponin (Point of Care Test)  Recent Labs  01/29/16 0919  TROPIPOC 0.01   No results for input(s): CKTOTAL, CKMB, TROPONINI in the last 72 hours. Lab Results  Component Value Date   WBC 9.3 01/29/2016   HGB 14.4 01/29/2016   HCT 42.9 01/29/2016   MCV 90.3 01/29/2016   PLT 243  01/29/2016    Recent Labs Lab 01/29/16 0856  NA 138  K 4.5  CL 104  CO2 22  BUN 18  CREATININE 1.01  CALCIUM 9.1  GLUCOSE 202*    Radiology Studies    Dg Chest 2 View: 01/04/2016  CLINICAL DATA:  Mid to left-sided chest pain.  High blood pressure. EXAM: CHEST  2 VIEW COMPARISON:  11/24/2015 FINDINGS: Borderline heart size with normal pulmonary vascularity. No focal airspace disease or consolidation in the lungs. No blunting of costophrenic angles. No pneumothorax. Mediastinal contours appear intact. Degenerative changes in the spine and shoulders. IMPRESSION: No active cardiopulmonary disease. Electronically Signed   By: Burman NievesWilliam  Stevens M.D.   On: 01/04/2016 06:40    EKG & Cardiac Imaging    EKG:  Atrial flutter, HR 68.  ECHOCARDIOGRAM: 11/27/2015 Study Conclusions - Left ventricle: The cavity size was normal. Wall thickness was  increased in a pattern of mild LVH. Systolic function was normal.  The  estimated ejection fraction was in the range of 55% to 60%.  Wall motion was normal; there were no regional wall motion  abnormalities. - Left atrium: The atrium was mildly dilated.  Assessment & Plan    1. Atrial Flutter - diagnosed with atrial fibrillation in 11/2015 with failed DCCV on 12/16/2015. Started on Flecainide and underwent repeat DCCV on 01/04/2016 while in the ED. Found to be in atrial fibrillation/atrial flutter when he presented for his stress test on 01/28/2016. - This patients CHA2DS2-VASc Score and unadjusted Ischemic Stroke Rate (% per year) is equal to 2.2 % stroke rate/year from a score of 2 (HTN, Age). Continue Eliquis for anticoagulation.  - will ask Dr. Graciela Husbands to see the patient for further recommendations and possible consideration of ablation.  2. Chest Pain at Rest - developed chest pressure at 1:00AM. Still reports a slight pressure with no associated symptoms. - initial troponin negative. Will cycle troponin values.  - had negative stress test on  01/28/2016. No indication for further ischemic evaluation at this time.  3. Accelerated HTN - BP ranging from 170/110 - 180/140 while in the ED. - was on Lopressor  BID and Losartan  daily prior to admission.  - start PRN IV Hydralazine with hold parameters. - will obtain renal US to evaluate for secondary causes.    Signed, Ellsworth Lennox, PA-C 01/29/2016, 11:06 AM Pager: [phone]  As above, patient seen and examined. Briefly he is a 65 year old male with past medical history of paroxysmal atrial fibrillation and hypertension for evaluation of atrial flutter, hypertension and chest pain. Patient has had 2 recent cardioversions. His most recent was on flecanide. This morning with dyspnea and chest "aching". His pain did not radiate. No associated symptoms. He presented to the emergency room electrocardiogram shows atrial flutter with no ST changes. Initial enzymes negative. 1 atrial flutter-patient has had recurrent atrial arrhythmias despite flecainide. Continue metoprolol for rate control. Continue apixaban (Chadsvasc 2). Will ask electrophysiology to review. Question ablation versus tikosyn. It is not completely clear to me that he is symptomatic but has noticed occasional fatigue and dyspnea which may be related to his atrial arrhythmias. Therefore rhythm control may be best option. 2 chest pain-symptoms atypical. Cycle enzymes. Recent nuclear study negative. If enzymes negative would not pursue further ischemia evaluation. 3 hypertension-blood pressure significantly elevated. Difficult to control recently. Check renal Dopplers. Continue metoprolol and ARB.Follow blood pressure and add additional medications as needed. Olga Millers

## 2016-01-30 DIAGNOSIS — I483 Typical atrial flutter: Secondary | ICD-10-CM | POA: Diagnosis not present

## 2016-01-30 LAB — HEPATIC FUNCTION PANEL
ALK PHOS: 61 U/L (ref 38–126)
ALT: 36 U/L (ref 17–63)
AST: 12 U/L — ABNORMAL LOW (ref 15–41)
Albumin: 3.6 g/dL (ref 3.5–5.0)
BILIRUBIN DIRECT: 0.1 mg/dL (ref 0.1–0.5)
BILIRUBIN TOTAL: 1 mg/dL (ref 0.3–1.2)
Indirect Bilirubin: 0.9 mg/dL (ref 0.3–0.9)
Total Protein: 6.1 g/dL — ABNORMAL LOW (ref 6.5–8.1)

## 2016-01-30 LAB — BASIC METABOLIC PANEL
ANION GAP: 11 (ref 5–15)
BUN: 18 mg/dL (ref 6–20)
CALCIUM: 9.1 mg/dL (ref 8.9–10.3)
CO2: 25 mmol/L (ref 22–32)
CREATININE: 1.09 mg/dL (ref 0.61–1.24)
Chloride: 106 mmol/L (ref 101–111)
Glucose, Bld: 146 mg/dL — ABNORMAL HIGH (ref 65–99)
Potassium: 3.9 mmol/L (ref 3.5–5.1)
SODIUM: 142 mmol/L (ref 135–145)

## 2016-01-30 LAB — CBC
HCT: 41.4 % (ref 39.0–52.0)
HEMOGLOBIN: 13.7 g/dL (ref 13.0–17.0)
MCH: 30.2 pg (ref 26.0–34.0)
MCHC: 33.1 g/dL (ref 30.0–36.0)
MCV: 91.2 fL (ref 78.0–100.0)
PLATELETS: 213 10*3/uL (ref 150–400)
RBC: 4.54 MIL/uL (ref 4.22–5.81)
RDW: 13.9 % (ref 11.5–15.5)
WBC: 6.1 10*3/uL (ref 4.0–10.5)

## 2016-01-30 LAB — TROPONIN I
TROPONIN I: 0.09 ng/mL — AB (ref <0.031)
Troponin I: <0.03 ng/mL (ref <0.031)

## 2016-01-30 LAB — TSH: TSH: 1.216 u[IU]/mL (ref 0.350–4.500)

## 2016-01-30 MED ORDER — AMIODARONE HCL 200 MG PO TABS: 200.0000 mg | ORAL_TABLET | Freq: Two times a day (BID) | ORAL | Status: DC

## 2016-01-30 MED ORDER — LABETALOL HCL 200 MG PO TABS: 200.0000 mg | ORAL_TABLET | Freq: Two times a day (BID) | ORAL | Status: DC

## 2016-01-30 MED ORDER — LABETALOL HCL 200 MG PO TABS
200.0000 mg | ORAL_TABLET | Freq: Two times a day (BID) | ORAL | Status: DC
  Administered 2016-01-30: 200 mg via ORAL
  Filled 2016-01-30: qty 1

## 2016-01-30 NOTE — Discharge Summary (Signed)
Discharge Summary    Patient ID: Jacob Meadows,  MRN: [id number], DOB/AGE: June 11, 1951 65 y.o.  Admit date: 01/29/2016 Discharge date: 01/30/2016  Primary Care Provider: Rodrigo Ran A Primary Cardiologist: Dr Graciela Husbands  Discharge Diagnoses    Principal Problem:   Atrial flutter [facility]) Active Problems:   Chest pain at rest   Accelerated hypertension   Allergies No Known Allergies  Diagnostic Studies/Procedures    ECG, CXR _____________   History of Present Illness     65 y.o. male with past medical history of persistent atrial fibrillation on Eliquis and HTN. He had recurrence of atrial fibrillation after DCCV and had chest pain. He was admitted for further evaluation and treatment.  Hospital Course     Consultants: EP   His cardiac enzymes were negative for MI. He had a recent MV that was negative for ischemia. Dr Graciela Husbands felt the symptoms were not likely cardiac and no further ischemic evaluation is needed.   Mr Grand CHADS2VASC=2. He is on Eliquis for anticoagulation and is to continue this.  He was in atrial fibrillation when he came in for stress test on 01/28/2016. He was on flecainide and metoprolol for rate control and rhythm control. Because of the recurrence of atrial fibrillation on flecainide, and his level of symptoms when in atrial fibrillation, he will be referred for ablation. In the meantime, he will be started on amiodarone. His beta blocker was changed to labetalol. After being loaded with amiodarone, he will return to the hospital for an outpatient cardioversion, planned for 03/20.   His blood pressure was significantly elevated on admission, as high as 186 systolic and 101 diastolic. His metoprolol was changed to labetalol. He is to continue other medications. This can be further assessed as an outpatient when he is not having acute symptoms.  On 01/30/2016, he was seen by Dr. Graciela Husbands and all data were reviewed. He is tolerating the amiodarone. He will need a  TSH and LFTs as an outpatient. No further inpatient workup is indicated and he is considered stable for discharge, to follow-up as an outpatient. Because of his hyperglycemia, he should also get a hemoglobin A1c.   _____________  Discharge Vitals Blood pressure 123/75, pulse 77, temperature 98.5 F (36.9 C), temperature source Oral, resp. rate 14, height  (1.778 m), weight 185 lb (83.915 kg), SpO2 99 %.  Filed Weights   01/29/16 0901 01/30/16 0630  Weight: 192 lb 1 oz (87.119 kg) 185 lb (83.915 kg)    Labs & Radiologic Studies     CBC  Recent Labs  01/29/16 0856 01/30/16 0636  WBC 9.3 6.1  HGB 14.4 13.7  HCT 42.9 41.4  MCV 90.3 91.2  PLT 243 213   Basic Metabolic Panel  Recent Labs  01/29/16 0856 01/30/16 0636  NA 138 142  K 4.5 3.9  CL 104 106  CO2 22 25  GLUCOSE 202* 146*  BUN 18 18  CREATININE 1.01 1.09  CALCIUM 9.1 9.1   Cardiac Enzymes  Recent Labs  01/29/16 1850 01/30/16 0120 01/30/16 0636  TROPONINI <0.03 0.09* <0.03    Dg Chest Portable 1 View 01/29/2016  CLINICAL DATA:  Atrial fibrillation with high blood pressure and history of hypertension EXAM: PORTABLE CHEST 1 VIEW COMPARISON:  01/04/2016 FINDINGS: Mild cardiac enlargement stable. Vascular pattern normal. Lungs clear. IMPRESSION: No acute findings Electronically Signed   By: Esperanza Heir M.D.   On: 01/29/2016 11:20    Disposition   Pt is being discharged  home today in good condition.  Follow-up Plans & Appointments    Follow-up Information    Follow up with Hillis RangeJames Allred, MD.   Specialty:  Cardiology   Why:  The office will call.   Contact information:   [address] [place] [address] [phone]       Follow up with Sherryl MangesSteven Klein, MD.   Specialty:  Cardiology   Why:  As scheduled   Contact information:   [address] [place] [address] [phone]       [facility] [address] [facility].   Specialty:  Cardiology     Why:  The will call you regarding outpatient cardioversion.   Contact information:   [address], [address] [place] [id number] [phone]     Discharge Instructions    Diet - low sodium heart healthy    Complete by:  As directed      Increase activity slowly    Complete by:  As directed            Discharge Medications   Current Discharge Medication List    START taking these medications   Details  amiodarone (PACERONE) 200 MG tablet Take 1-2 tablets (200-400 mg total) by mouth 2 (two) times daily. 2 tabs bid for 1 week, then 1 tab bid for 1 week, then 1 tab daily Qty: 60 tablet, Refills: 3    labetalol (NORMODYNE) 200 MG tablet Take 1 tablet (200 mg total) by mouth 2 (two) times daily. Qty: 60 tablet, Refills: 11      CONTINUE these medications which have NOT CHANGED   Details  apixaban (ELIQUIS) 5 MG TABS tablet Take 1 tablet (5 mg total) by mouth 2 (two) times daily. Qty: 60 tablet, Refills: 6    ibuprofen (ADVIL,MOTRIN) 200 MG tablet Take 200 mg by mouth every 6 (six) hours as needed for moderate pain.    losartan (COZAAR) 100 MG tablet Take 1 tablet (100 mg total) by mouth daily. Qty: 30 tablet, Refills: 6    Multiple Vitamins-Minerals (CENTRUM SILVER ADULT 50+ PO) Take 1 tablet by mouth daily.      STOP taking these medications     flecainide (TAMBOCOR) 100 MG tablet      metoprolol (LOPRESSOR) 50 MG tablet          Outstanding Labs/Studies   none  Duration of Discharge Encounter   Greater than 30 minutes including physician time.  Melida QuitterSigned, Makinsley Schiavi NP 01/30/2016, 12:21 PM

## 2016-01-30 NOTE — Progress Notes (Signed)
Labs drawn, pt given discharge instructions verablized understanding. IV and tele removed.Wife at the bedside

## 2016-01-30 NOTE — Progress Notes (Signed)
       Patient Name: Jacob MerinoHenry W Nikolic      SUBJECTIVE: Without complaints no chest pain or shortness of breath  Past Medical History  Diagnosis Date  . Essential hypertension 11/24/2015  . Persistent atrial fibrillation (HCC) 11/24/2015  . Sleep-disordered breathing 11/24/2015  . Nonspecific abnormal electrocardiogram (ECG) low voltage limb leads 11/24/2015  . Atrial flutter (HCC)     "just the last week or so" (01/29/2016)    Scheduled Meds:  Scheduled Meds: . amiodarone  400 mg Oral BID  . apixaban  5 mg Oral BID  . labetalol  200 mg Oral BID  . losartan  100 mg Oral Daily   Continuous Infusions:  acetaminophen, hydrALAZINE, ondansetron (ZOFRAN) IV    PHYSICAL EXAM Filed Vitals:   01/29/16 1641 01/29/16 2112 01/30/16 0019 01/30/16 0630  BP: 141/84 122/77 95/56 123/75  Pulse: 76 70 78 77  Temp: 98.8 F (37.1 C) 98.5 F (36.9 C) 98.3 F (36.8 C) 98.5 F (36.9 C)  TempSrc:  Oral Oral Oral  Resp: 16 18 16 14   Height:      Weight:    185 lb (83.915 kg)  SpO2: 98% 97% 93% 99%    Well developed and nourished in no acute distress HENT normal Neck supple with JVP-flat Clear Regular rate and rhythm, no murmurs or gallops Abd-soft with active BS No Clubbing cyanosis edema Skin-warm and dry A & Oriented  Grossly normal sensory and motor function  TELEMETRY: Reviewed telemetry pt in aflutter:    Intake/Output Summary (Last 24 hours) at 01/30/16 0649 Last data filed at 01/29/16 1700  Gross per 24 hour  Intake      0 ml  Output      0 ml  Net      0 ml    LABS: Basic Metabolic Panel:  Recent Labs Lab 01/28/16 0758 01/29/16 0856  NA 140 138  K 4.3 4.5  CL 103 104  CO2 27 22  GLUCOSE 122* 202*  BUN 19 18  CREATININE 0.97 1.01  CALCIUM 8.9 9.1   Cardiac Enzymes:  Recent Labs  01/29/16 1850 01/30/16 0120  TROPONINI <0.03 0.09*   CBC:  Recent Labs Lab 01/29/16 0856  WBC 9.3  HGB 14.4  HCT 42.9  MCV 90.3  PLT 243   PROTIME: No results for  input(s): LABPROT, INR in the last 72 hours. Liver Function Tests: No results for input(s): AST, ALT, ALKPHOS, BILITOT, PROT, ALBUMIN in the last 72 hours. No results for input(s): LIPASE, AMYLASE in the last 72 hours. BNP: BNP (last 3 results) No results for input(s): BNP in the last 8760 hours.   ASSESSMENT AND PLAN:  Principal Problem:   Atrial flutter (HCC) Active Problems:   Chest pain at rest   Accelerated hypertension  +  TN In setting of neg myoview and the temporal pattern, i find it hard to convince myself that this is cardiac Will discharge today on amio labetolol 300>>200 bid  And cozaar Needs appt for DCCV a week from Monday appt to see JA/WC for RFCA of AFib ( i have sent this request to office)  Signed, Sherryl MangesSteven Klein MD  01/30/2016

## 2016-02-01 ENCOUNTER — Ambulatory Visit (INDEPENDENT_AMBULATORY_CARE_PROVIDER_SITE_OTHER): Payer: BLUE CROSS/BLUE SHIELD | Admitting: Cardiology

## 2016-02-01 ENCOUNTER — Encounter: Payer: Self-pay | Admitting: Cardiology

## 2016-02-01 VITALS — BP 184/86 | HR 92 | Ht 69.0 in | Wt 194.4 lb

## 2016-02-01 DIAGNOSIS — I483 Typical atrial flutter: Secondary | ICD-10-CM | POA: Diagnosis not present

## 2016-02-01 DIAGNOSIS — I10 Essential (primary) hypertension: Secondary | ICD-10-CM | POA: Diagnosis not present

## 2016-02-01 LAB — HEMOGLOBIN A1C
Hgb A1c MFr Bld: 7.1 % — ABNORMAL HIGH (ref 4.8–5.6)
Mean Plasma Glucose: 157 mg/dL

## 2016-02-01 MED ORDER — CARVEDILOL 12.5 MG PO TABS: 12.5000 mg | ORAL_TABLET | Freq: Two times a day (BID) | ORAL | Status: DC

## 2016-02-01 NOTE — Progress Notes (Signed)
Electrophysiology Office Note   Date:  02/01/2016   ID:  Jacob Meadows, DOB 06/13/1951, MRN [id number]  PCP:  Jacob KayserPERINI,[name], MD  Primary Electrophysiologist:  Jacob Meadows   No chief complaint on file.    History of Present Illness: Jacob Meadows is Meadows 65 y.o. male who presents today for electrophysiology evaluation.  Jacob Meadows is Meadows 65 y.o. male with history of Paroxysmal AFib, HTN, ?OSA pending sleep study.  [facility] with c/o chest discomfort and noted to be in AFib though with RVR, and HTN with BP 150's-200 range, he was cardioverted in the ED, observed and eventually discharged from the ER with 2 negative trop and SR, BMET/CBC, CXR were unremarkable, The patient states he was told to increase his losartan to 100mg  daily, so he has been taking 50mg  BID, which he has been taking since last week.  He states that he woke with significant pressure in his head primarily, his impression as that his BP was high, though after Meadows couple hours felt like he also did feel an uncomfortable feeling in his chest and went to the ER. Record notes he was observed to have SBP as high the low 200's.   He was readmitted to the hospital over the weekend with chest pain, SOB, and palpitations.  Was found to be hypertensive and in atrial flutter.  He was ruled out for ischemia at that time.  Amiodarone was started and he was set up for outpatient cardioversion.   Today, he denies symptoms of palpitations, chest pain, shortness of breath, orthopnea, PND, lower extremity edema, claudication, dizziness, presyncope, syncope, bleeding, or neurologic sequela. He is tolerating the amiodarone well without complaint.  He does continue to have significant fatigue.  His BP has been elevate as an outpatient as well.  Past Medical History  Diagnosis Date  . Essential hypertension 11/24/2015  . Persistent atrial fibrillation (HCC) 11/24/2015  . Sleep-disordered breathing 11/24/2015  . Nonspecific abnormal  electrocardiogram (ECG) low voltage limb leads 11/24/2015  . Atrial flutter (HCC)     "just the last week or so" (01/29/2016)   Past Surgical History  Procedure Laterality Date  . Cardioversion N/Meadows 12/16/2015    Procedure: CARDIOVERSION;  Surgeon: Jacob NoseKenneth C Hilty, MD;  [facility];  Service: Cardiovascular;  Laterality: N/Meadows;  . Tonsillectomy  1957  . Cardioversion  12/2015    "done in ER"     Current Outpatient Prescriptions  Medication Sig Dispense Refill  . amiodarone (PACERONE) 200 MG tablet Take 1-2 tablets (200-400 mg total) by mouth 2 (two) times daily. 2 tabs bid for 1 week, then 1 tab bid for 1 week, then 1 tab daily 60 tablet 3  . apixaban (ELIQUIS) 5 MG TABS tablet Take 1 tablet (5 mg total) by mouth 2 (two) times daily. 60 tablet 6  . ibuprofen (ADVIL,MOTRIN) 200 MG tablet Take 200 mg by mouth every 6 (six) hours as needed for moderate pain.    Marland Kitchen. labetalol (NORMODYNE) 200 MG tablet Take 1 tablet (200 mg total) by mouth 2 (two) times daily. 60 tablet 11  . losartan (COZAAR) 100 MG tablet Take 1 tablet (100 mg total) by mouth daily. 30 tablet 6  . Multiple Vitamins-Minerals (CENTRUM SILVER ADULT 50+ PO) Take 1 tablet by mouth daily.     No current facility-administered medications for this visit.    Allergies:   Review of patient's allergies indicates no known allergies.   Social History:  The patient  reports that he has been smoking Cigars.  He has never used smokeless tobacco. He reports that he drinks about 4.2 - 5.4 oz of alcohol per week. He reports that he does not use illicit drugs.   Family History:  The patient's family history includes Alzheimer's disease in his mother; Atrial fibrillation in his father; Congestive Heart Failure in his father.    ROS:  Please see the history of present illness.   Otherwise, review of systems is positive for fatigue, weakness.   All other systems are reviewed and negative.    PHYSICAL EXAM: VS:  BP 184/86 mmHg  Pulse 92   Ht  (1.753 m)  Wt 194 lb 6.4 oz (88.179 kg)  BMI 28.69 kg/m2 , BMI Body mass index is 28.69 kg/(m^2). GEN: Well nourished, well developed, in no acute distress HEENT: normal Neck: no JVD, carotid bruits, or masses Cardiac: RRR; no murmurs, rubs, or gallops,no edema  Respiratory:  clear to auscultation bilaterally, normal work of breathing GI: soft, nontender, nondistended, + BS MS: no deformity or atrophy Skin: warm and dry Neuro:  Strength and sensation are intact Psych: euthymic mood, full affect  EKG:  EKG is ordered today. The ekg ordered today shows atrial flutter, rate 91  Recent Labs: 11/24/2015: Magnesium 1.8 01/30/2016: ALT 36; BUN 18; Creatinine, Ser 1.09; Hemoglobin 13.7; Platelets 213; Potassium 3.9; Sodium 142; TSH 1.216    Lipid Panel  No results found for: CHOL, TRIG, HDL, CHOLHDL, VLDL, LDLCALC, LDLDIRECT   Wt Readings from Last 3 Encounters:  02/01/16 194 lb 6.4 oz (88.179 kg)  01/30/16 185 lb (83.915 kg)  01/15/16 191 lb (86.637 kg)      Other studies Reviewed: Additional studies/ records that were reviewed today include: TTE 11/27/15, SPECT 01/28/16   Review of the above records today demonstrates:   - Left ventricle: The cavity size was normal. Wall thickness was  increased in Meadows pattern of mild LVH. Systolic function was normal.  The estimated ejection fraction was in the range of 55% to 60%.  Wall motion was normal; there were no regional wall motion  abnormalities. - Left atrium: The atrium was mildly dilated.   Nuclear stress EF: 46%.  There was no ST segment deviation noted during stress.  No T wave inversion was noted during stress.  The study is normal.  This is Meadows low risk study.  The left ventricular ejection fraction is mildly decreased (45-54%).  Mild septal hypokinesis.  ASSESSMENT AND PLAN:  1.  Persistent atrial fibrillation/atrial flutter: Has been on flecainide in the past with pro-arrhythmic side effects.  Has  evidence of both AF and flutter and is now persistent.  Started on amiodarone and [name] get cardioversion after loading on 3/20.  Discussed further options.  The patient has heard about ablation and is interested.  Discussed risks and benefits and told him that he has approximately Meadows 75-80% chance of AF free at 5 years.  He feels that this is Meadows good option.  Risks include bleeding, tamponade, heart block, stroke, and damage to surrounding organs.  The patient understands these risks and has agreed to proceed.  He is on Eliquis and has not missed Meadows dose since January.  This patients CHA2DS2-VASc Score and unadjusted Ischemic Stroke Rate (% per year) is equal to 2.2 % stroke rate/year from Meadows score of 2  Above score calculated as 1 point each if present [CHF, HTN, DM, Vascular=MI/PAD/Aortic Plaque, Age if 68-74, or Male] Above  score calculated as 2 points each if present [Age > 75, or Stroke/TIA/TE]   2. Hypertension: still with elevated BP.  Added coreg 12.5 mg BID.    Current medicines are reviewed at length with the patient today.   The patient does not have concerns regarding his medicines.  The following changes were made today:  Coreg 12.5 mg BID  Labs/ tests ordered today include:  No orders of the defined types were placed in this encounter.     Disposition:   FU with Azharia Surratt post ablation  Signed, Vivica Dobosz Jorja Loa, MD  02/01/2016 12:12 PM     [facility] [address] [place] [address] [phone] (office) [phone] (fax)

## 2016-02-01 NOTE — Telephone Encounter (Signed)
Spoke with patient's wife and patient is in the background answering questions.  Wife states patient's BP has been rising since yesterday; [facility].  States today readings have been 175/105 earlier this morning and now 192/108 mmHg, HR 77 bpm.  She states patient has taken all medications as directed including labetalol, amiodarone, eliquis, and losartan.  Reports  he took morning meds today at about 0630.  Patient is currently resting at home.  When discharged, patient's BP was 123/75 mmgHg.  Wife is very concerned about elevated BP.  I advised her that I will review with Dr. Graciela HusbandsKlein or the DOD and call her back.  I advised her to have patient continue to rest and to avoid salty foods or strenuous activities.  She verbalized understanding and agreement.

## 2016-02-01 NOTE — Telephone Encounter (Signed)
Follow up      Pt was released from the hosp on Saturday.  His bp now is 175/105.  Wife want to know if they should go back to the hosp.  He is also shaky.  Patient had a restless night last night.  Please advise

## 2016-02-01 NOTE — Patient Instructions (Signed)
Medication Instructions:  Your physician has recommended you make the following change in your medication:  START Coreg 12.5mg  twice daily. An Rx has been sent to your pharmacy   Labwork: None ordered  Testing/Procedures: Your physician has recommended that you have an ablation. Catheter ablation is a medical procedure used to treat some cardiac arrhythmias (irregular heartbeats). During catheter ablation, a long, thin, flexible tube is put into a blood vessel in your groin (upper thigh), or neck. This tube is called an ablation catheter. It is then guided to your heart through the blood vessel. Radio frequency waves destroy small areas of heart tissue where abnormal heartbeats may cause an arrhythmia to start. Please see the instruction sheet given to you today. (We will call you to schedule)  Follow-Up: Your physician recommends that you schedule a follow-up appointment pending your procedure   Any Other Special Instructions Will Be Listed Below (If Applicable).     If you need a refill on your cardiac medications before your next appointment, please call your pharmacy.

## 2016-02-01 NOTE — Telephone Encounter (Signed)
I reviewed patient's symptoms with Norma FredricksonLori Gerhardt, NP who advised that patient should come in today to see Dr. Elberta Fortisamnitz since he has openings and per Dr. Odessa FlemingKlein's plan at discharge on 3/11: "appt to see JA/WC for RFCA of AFib ( i have sent this request to office)." She advised patient should bring BP cuff with them for calibration.   I reviewed her advice with patient's wife who verbalized understanding and agreement.  Patient is scheduled to see Dr. Elberta Fortisamnitz at 12:00 today.  She thanked me for the call.

## 2016-02-02 ENCOUNTER — Telehealth: Payer: Self-pay | Admitting: Cardiology

## 2016-02-02 DIAGNOSIS — I4819 Other persistent atrial fibrillation: Secondary | ICD-10-CM

## 2016-02-02 DIAGNOSIS — Z01812 Encounter for preprocedural laboratory examination: Secondary | ICD-10-CM

## 2016-02-02 NOTE — Telephone Encounter (Signed)
New Message  Pt wife calling to sched procedure. Please call back and discuss.

## 2016-02-03 ENCOUNTER — Ambulatory Visit (HOSPITAL_COMMUNITY)
Admission: RE | Admit: 2016-02-03 | Discharge: 2016-02-03 | Disposition: A | Discharge status: Home / Self Care | Payer: BLUE CROSS/BLUE SHIELD | Source: Ambulatory Visit | Attending: Internal Medicine | Admitting: Internal Medicine

## 2016-02-03 DIAGNOSIS — I10 Essential (primary) hypertension: Secondary | ICD-10-CM

## 2016-02-03 NOTE — Telephone Encounter (Signed)
Follow Up:; ° ° °Returning your call. °

## 2016-02-03 NOTE — Telephone Encounter (Signed)
Follow up      Retuning a call to the nurse

## 2016-02-03 NOTE — Telephone Encounter (Signed)
Pt's wife spoke with a triage nurse this morning Synetta Fail(Anita) about another matter. Synetta Failnita informed her that I would be in contact with them this week on ablation details/instructions. Wife is understands and is agreeable to plan.

## 2016-02-03 NOTE — Telephone Encounter (Signed)
Called patient to discuss ablation instructions.  He is currently at teacher conference at dtr's school and will call me back. He asked that I call and leave a detailed message w/ my name/number so he can call me tomorrow. lmtcb on cellphone

## 2016-02-04 NOTE — Telephone Encounter (Signed)
Informed wife that I was going to review w/ Graciela HusbandsKlein today if DCCV still needed since ablation is scheduled for 4/4. She agrees with this.  She knows I will call her today/tomorrow with recommendations and review instructions.  She also asked about renal us recently performed.  Told her I would ask Dr. Graciela HusbandsKlein to review.

## 2016-02-04 NOTE — Telephone Encounter (Signed)
Follow up   Pt wife returning call

## 2016-02-08 ENCOUNTER — Encounter: Payer: Self-pay | Admitting: Cardiology

## 2016-02-11 NOTE — Addendum Note (Signed)
Addended by: Reesa ChewJONES, Trevante Tennell G on: 02/11/2016 05:17 PM   Modules accepted: Orders

## 2016-02-11 NOTE — Telephone Encounter (Signed)
Spoke with patient and his wife this morning. Patient feeling good.  No SOB. Seen by his PCP last week and they changed meds around for better BP control. Reports BPs down to 140-150s compared to 180-190s.  Asked about heart rhythm and patient tells me that he is in and out of rhythm, feels like it is more flutter than fib.  No symptoms while out of rhythm at this time.  Will forward to Dr. Elberta Fortisamnitz for advisement on DCCV next week, prior to ablation the following week. Should we schedule DCCV prior to ablation to attempt  NSR before procedure or since asymptomatic ok to wait for ablation? Pt states he is ok with whatever you feel is the best course of action.

## 2016-02-15 ENCOUNTER — Encounter: Payer: Self-pay | Admitting: *Deleted

## 2016-02-15 NOTE — Telephone Encounter (Signed)
Reviewed afib ablation instructions with patient. Letter of instructions left at front desk for him to pick up Wednesday, before his CT. Pre procedure labs scheduled for Wednesday. Patient verbalized understanding and agreeable to plan.

## 2016-02-17 ENCOUNTER — Other Ambulatory Visit (INDEPENDENT_AMBULATORY_CARE_PROVIDER_SITE_OTHER): Payer: BLUE CROSS/BLUE SHIELD | Admitting: *Deleted

## 2016-02-17 ENCOUNTER — Encounter (HOSPITAL_COMMUNITY): Payer: Self-pay

## 2016-02-17 ENCOUNTER — Telehealth (HOSPITAL_BASED_OUTPATIENT_CLINIC_OR_DEPARTMENT_OTHER): Payer: Self-pay | Admitting: Emergency Medicine

## 2016-02-17 ENCOUNTER — Ambulatory Visit (HOSPITAL_COMMUNITY)
Admission: RE | Admit: 2016-02-17 | Discharge: 2016-02-17 | Disposition: A | Discharge status: Home / Self Care | Payer: BLUE CROSS/BLUE SHIELD | Source: Ambulatory Visit | Attending: Cardiology | Admitting: Cardiology

## 2016-02-17 DIAGNOSIS — I4819 Other persistent atrial fibrillation: Secondary | ICD-10-CM

## 2016-02-17 DIAGNOSIS — Z01812 Encounter for preprocedural laboratory examination: Secondary | ICD-10-CM

## 2016-02-17 DIAGNOSIS — I481 Persistent atrial fibrillation: Secondary | ICD-10-CM

## 2016-02-17 LAB — CBC
HCT: 39.8 % (ref 39.0–52.0)
HEMOGLOBIN: 13.3 g/dL (ref 13.0–17.0)
MCH: 31.2 pg (ref 26.0–34.0)
MCHC: 33.4 g/dL (ref 30.0–36.0)
MCV: 93.4 fL (ref 78.0–100.0)
PLATELETS: 267 10*3/uL (ref 150–400)
RBC: 4.26 MIL/uL (ref 4.22–5.81)
RDW: 14.6 % (ref 11.5–15.5)
WBC: 7.8 10*3/uL (ref 4.0–10.5)

## 2016-02-17 LAB — BASIC METABOLIC PANEL
ANION GAP: 9 (ref 5–15)
BUN: 15 mg/dL (ref 6–20)
CO2: 26 mmol/L (ref 22–32)
Calcium: 9.2 mg/dL (ref 8.9–10.3)
Chloride: 105 mmol/L (ref 101–111)
Creatinine, Ser: 1.04 mg/dL (ref 0.61–1.24)
Glucose, Bld: 130 mg/dL — ABNORMAL HIGH (ref 65–99)
POTASSIUM: 4.2 mmol/L (ref 3.5–5.1)
SODIUM: 140 mmol/L (ref 135–145)

## 2016-02-17 MED ORDER — IOPAMIDOL (ISOVUE-370) INJECTION 76%
INTRAVENOUS | Status: AC
  Administered 2016-02-17: 100 mL
  Filled 2016-02-17: qty 100

## 2016-02-22 ENCOUNTER — Telehealth: Payer: Self-pay | Admitting: Cardiology

## 2016-02-22 NOTE — Anesthesia Preprocedure Evaluation (Addendum)
Anesthesia Evaluation  Patient identified by MRN, date of birth, ID band Patient awake    Reviewed: Allergy & Precautions, NPO status , Patient's Chart, lab work & pertinent test results  History of Anesthesia Complications Negative for: history of anesthetic complications  Airway Mallampati: II  TM Distance: >3 FB Neck ROM: Full    Dental no notable dental hx.    Pulmonary Current Smoker,  "sleep-disordered breathing" ?OSA pending sleep study   Pulmonary exam normal breath sounds clear to auscultation       Cardiovascular Exercise Tolerance: Good hypertension, Pt. on medications Normal cardiovascular exam+ dysrhythmias Atrial Fibrillation  Rhythm:Regular Rate:Normal     Neuro/Psych negative neurological ROS  negative psych ROS   GI/Hepatic negative GI ROS, Neg liver ROS,   Endo/Other  negative endocrine ROS  Renal/GU negative Renal ROS  negative genitourinary   Musculoskeletal negative musculoskeletal ROS (+)   Abdominal   Peds negative pediatric ROS (+)  Hematology negative hematology ROS (+)   Anesthesia Other Findings   Reproductive/Obstetrics negative OB ROS                          Echo 11/27/15: Study Conclusions - Left ventricle: The cavity size was normal. Wall thickness was increased in a pattern of mild LVH. Systolic function was normal. The estimated ejection fraction was in the range of 55% to 60%. Wall motion was normal; there were no regional wall motion abnormalities. - Left atrium: The atrium was mildly dilated.   BP Readings from Last 3 Encounters:  02/01/16 184/86  01/30/16 123/75  01/15/16 138/92   Lab Results  Component Value Date   WBC 7.8 02/17/2016   HGB 13.3 02/17/2016   HCT 39.8 02/17/2016   MCV 93.4 02/17/2016   PLT 267 02/17/2016     Chemistry      Component Value Date/Time   NA 140 02/17/2016 0839   K 4.2 02/17/2016 0839   CL 105 02/17/2016  0839   CO2 26 02/17/2016 0839   BUN 15 02/17/2016 0839   CREATININE 1.04 02/17/2016 0839   CREATININE 0.97 01/28/2016 0758      Component Value Date/Time   CALCIUM 9.2 02/17/2016 0839   ALKPHOS 61 01/30/2016 1303   AST 12* 01/30/2016 1303   ALT 36 01/30/2016 1303   BILITOT 1.0 01/30/2016 1303     No results found for: INR, PROTIME   Anesthesia Physical Anesthesia Plan  ASA: III  Anesthesia Plan: General   Post-op Pain Management:    Induction: Intravenous  Airway Management Planned: LMA  Additional Equipment:   Intra-op Plan:   Post-operative Plan: Extubation in OR  Informed Consent: I have reviewed the patients History and Physical, chart, labs and discussed the procedure including the risks, benefits and alternatives for the proposed anesthesia with the patient or authorized representative who has indicated his/her understanding and acceptance.   Dental advisory given  Plan Discussed with: CRNA and Surgeon  Anesthesia Plan Comments:         Anesthesia Quick Evaluation

## 2016-02-22 NOTE — Telephone Encounter (Signed)
Advised patient to be at hospital at 5:30 a.m.  Explained procedure scheduled to begin at  7:30 a.m.  He thanks me for calling back and giving him information to give to family.

## 2016-02-22 NOTE — Telephone Encounter (Signed)
New message      Pt want to know about what time will the ablation begin tomorrow?

## 2016-02-23 ENCOUNTER — Ambulatory Visit (HOSPITAL_COMMUNITY): Payer: BLUE CROSS/BLUE SHIELD | Admitting: Certified Registered Nurse Anesthetist

## 2016-02-23 ENCOUNTER — Encounter (HOSPITAL_COMMUNITY)
Admission: RE | Disposition: A | Discharge status: Home / Self Care | Payer: Self-pay | Source: Ambulatory Visit | Attending: Cardiology

## 2016-02-23 ENCOUNTER — Ambulatory Visit (HOSPITAL_COMMUNITY)
Admission: RE | Admit: 2016-02-23 | Discharge: 2016-02-24 | Disposition: A | Discharge status: Home / Self Care | Payer: BLUE CROSS/BLUE SHIELD | Source: Ambulatory Visit | Attending: Cardiology | Admitting: Cardiology

## 2016-02-23 ENCOUNTER — Encounter (HOSPITAL_COMMUNITY): Payer: Self-pay | Admitting: General Practice

## 2016-02-23 DIAGNOSIS — I10 Essential (primary) hypertension: Secondary | ICD-10-CM | POA: Diagnosis not present

## 2016-02-23 DIAGNOSIS — Z7901 Long term (current) use of anticoagulants: Secondary | ICD-10-CM | POA: Insufficient documentation

## 2016-02-23 DIAGNOSIS — I4891 Unspecified atrial fibrillation: Secondary | ICD-10-CM | POA: Diagnosis present

## 2016-02-23 DIAGNOSIS — I481 Persistent atrial fibrillation: Secondary | ICD-10-CM | POA: Insufficient documentation

## 2016-02-23 DIAGNOSIS — I480 Paroxysmal atrial fibrillation: Secondary | ICD-10-CM | POA: Insufficient documentation

## 2016-02-23 DIAGNOSIS — I483 Typical atrial flutter: Secondary | ICD-10-CM | POA: Diagnosis not present

## 2016-02-23 HISTORY — PX: ELECTROPHYSIOLOGIC STUDY: SHX172A

## 2016-02-23 HISTORY — PX: ATRIAL FIBRILLATION ABLATION: SHX5732

## 2016-02-23 HISTORY — DX: Pure hypercholesterolemia, unspecified: E78.00

## 2016-02-23 HISTORY — PX: ATRIAL FLUTTER ABLATION: SHX5733

## 2016-02-23 LAB — POCT ACTIVATED CLOTTING TIME
ACTIVATED CLOTTING TIME: 286 s
ACTIVATED CLOTTING TIME: 183 s
ACTIVATED CLOTTING TIME: 322 s
Activated Clotting Time: 229 seconds
Activated Clotting Time: 291 seconds

## 2016-02-23 SURGERY — ATRIAL FIBRILLATION ABLATION
Anesthesia: General

## 2016-02-23 MED ORDER — SODIUM CHLORIDE 0.9% FLUSH
3.0000 mL | Freq: Two times a day (BID) | INTRAVENOUS | Status: DC
  Administered 2016-02-23: 23:00:00 3 mL via INTRAVENOUS

## 2016-02-23 MED ORDER — PHENYLEPHRINE HCL 10 MG/ML IJ SOLN
10.0000 mg | INTRAVENOUS | Status: DC | PRN
  Administered 2016-02-23: 20 ug/min via INTRAVENOUS
  Administered 2016-02-23: 60 ug/min via INTRAVENOUS

## 2016-02-23 MED ORDER — SUCCINYLCHOLINE CHLORIDE 20 MG/ML IJ SOLN
INTRAMUSCULAR | Status: DC | PRN
  Administered 2016-02-23: 100 mg via INTRAVENOUS

## 2016-02-23 MED ORDER — EPHEDRINE SULFATE 50 MG/ML IJ SOLN
INTRAMUSCULAR | Status: DC | PRN
  Administered 2016-02-23: 10 mg via INTRAVENOUS

## 2016-02-23 MED ORDER — SODIUM CHLORIDE 0.9 % IV SOLN: 250.0000 mL | INTRAVENOUS | Status: DC | PRN

## 2016-02-23 MED ORDER — PROTAMINE SULFATE 10 MG/ML IV SOLN
INTRAVENOUS | Status: DC | PRN
  Administered 2016-02-23 (×4): 10 mg via INTRAVENOUS

## 2016-02-23 MED ORDER — LOSARTAN POTASSIUM 50 MG PO TABS
100.0000 mg | ORAL_TABLET | Freq: Every day | ORAL | Status: DC
  Administered 2016-02-23 – 2016-02-24 (×2): 100 mg via ORAL
  Filled 2016-02-23 (×3): qty 2

## 2016-02-23 MED ORDER — BUPIVACAINE HCL (PF) 0.25 % IJ SOLN
INTRAMUSCULAR | Status: AC
  Filled 2016-02-23: qty 30

## 2016-02-23 MED ORDER — SODIUM CHLORIDE 0.9 % IV SOLN
INTRAVENOUS | Status: DC | PRN
  Administered 2016-02-23 (×2): via INTRAVENOUS

## 2016-02-23 MED ORDER — APIXABAN 5 MG PO TABS
5.0000 mg | ORAL_TABLET | Freq: Two times a day (BID) | ORAL | Status: DC
Start: 2016-02-23 — End: 2016-02-24
  Administered 2016-02-23 – 2016-02-24 (×2): 5 mg via ORAL
  Filled 2016-02-23 (×2): qty 1

## 2016-02-23 MED ORDER — ONDANSETRON HCL 4 MG/2ML IJ SOLN: 4.0000 mg | Freq: Four times a day (QID) | INTRAMUSCULAR | Status: DC | PRN

## 2016-02-23 MED ORDER — FENTANYL CITRATE (PF) 100 MCG/2ML IJ SOLN: 25.0000 ug | INTRAMUSCULAR | Status: DC | PRN

## 2016-02-23 MED ORDER — HEPARIN (PORCINE) IN NACL 2-0.9 UNIT/ML-% IJ SOLN
INTRAMUSCULAR | Status: AC
  Filled 2016-02-23: qty 500

## 2016-02-23 MED ORDER — PROPOFOL 10 MG/ML IV BOLUS
INTRAVENOUS | Status: DC | PRN
  Administered 2016-02-23: 150 mg via INTRAVENOUS
  Administered 2016-02-23: 50 mg via INTRAVENOUS

## 2016-02-23 MED ORDER — OFF THE BEAT BOOK
Freq: Once | Status: AC
  Filled 2016-02-23: qty 1

## 2016-02-23 MED ORDER — SODIUM CHLORIDE 0.9% FLUSH: 3.0000 mL | INTRAVENOUS | Status: DC | PRN

## 2016-02-23 MED ORDER — HEPARIN SODIUM (PORCINE) 1000 UNIT/ML IJ SOLN
INTRAMUSCULAR | Status: AC
  Filled 2016-02-23: qty 1

## 2016-02-23 MED ORDER — HEPARIN (PORCINE) IN NACL 2-0.9 UNIT/ML-% IJ SOLN
INTRAMUSCULAR | Status: DC | PRN
  Administered 2016-02-23: 08:00:00

## 2016-02-23 MED ORDER — ACETAMINOPHEN 325 MG PO TABS: 650.0000 mg | ORAL_TABLET | ORAL | Status: DC | PRN

## 2016-02-23 MED ORDER — OFF THE BEAT BOOK
Freq: Once | Status: AC
  Administered 2016-02-23: 17:00:00
  Filled 2016-02-23: qty 1

## 2016-02-23 MED ORDER — MIDAZOLAM HCL 5 MG/5ML IJ SOLN
INTRAMUSCULAR | Status: DC | PRN
  Administered 2016-02-23: 2 mg via INTRAVENOUS

## 2016-02-23 MED ORDER — AMIODARONE HCL 200 MG PO TABS
200.0000 mg | ORAL_TABLET | Freq: Every day | ORAL | Status: DC
  Administered 2016-02-23 – 2016-02-24 (×2): 200 mg via ORAL
  Filled 2016-02-23 (×3): qty 1

## 2016-02-23 MED ORDER — BUPIVACAINE HCL (PF) 0.25 % IJ SOLN
INTRAMUSCULAR | Status: DC | PRN
  Administered 2016-02-23: 20 mL

## 2016-02-23 MED ORDER — FENTANYL CITRATE (PF) 100 MCG/2ML IJ SOLN
INTRAMUSCULAR | Status: DC | PRN
  Administered 2016-02-23: 100 ug via INTRAVENOUS

## 2016-02-23 MED ORDER — HEPARIN SODIUM (PORCINE) 1000 UNIT/ML IJ SOLN
INTRAMUSCULAR | Status: DC | PRN
  Administered 2016-02-23 (×2): 1000 [IU] via INTRAVENOUS

## 2016-02-23 MED ORDER — LIDOCAINE HCL (CARDIAC) 20 MG/ML IV SOLN
INTRAVENOUS | Status: DC | PRN
  Administered 2016-02-23: 70 mg via INTRAVENOUS

## 2016-02-23 MED ORDER — HEPARIN SODIUM (PORCINE) 1000 UNIT/ML IJ SOLN
INTRAMUSCULAR | Status: DC | PRN
  Administered 2016-02-23: 13000 [IU] via INTRAVENOUS
  Administered 2016-02-23: 5000 [IU] via INTRAVENOUS
  Administered 2016-02-23: 3000 [IU] via INTRAVENOUS
  Administered 2016-02-23: 5000 [IU] via INTRAVENOUS
  Administered 2016-02-23: 2000 [IU] via INTRAVENOUS

## 2016-02-23 MED ORDER — DEXAMETHASONE SODIUM PHOSPHATE 4 MG/ML IJ SOLN
INTRAMUSCULAR | Status: DC | PRN
  Administered 2016-02-23: 8 mg via INTRAVENOUS

## 2016-02-23 MED ORDER — IBUPROFEN 200 MG PO TABS: 200.0000 mg | ORAL_TABLET | Freq: Four times a day (QID) | ORAL | Status: DC | PRN

## 2016-02-23 SURGICAL SUPPLY — 19 items
BAG SNAP BAND KOVER 36X36 (MISCELLANEOUS) ×3 IMPLANT
BLANKET WARM UNDERBOD FULL ACC (MISCELLANEOUS) ×3 IMPLANT
CATH NAVISTAR SMARTTOUCH DF (ABLATOR) ×3 IMPLANT
CATH SOUNDSTAR 3D IMAGING (CATHETERS) ×3 IMPLANT
CATH VARIABLE LASSO NAV 2515 (CATHETERS) ×3 IMPLANT
CATH WEBSTER BI DIR CS D-F CRV (CATHETERS) ×3 IMPLANT
COVER SWIFTLINK CONNECTOR (BAG) ×3 IMPLANT
NEEDLE TRANSSEPTAL BRK 98CM (NEEDLE) ×3 IMPLANT
PACK EP LATEX FREE (CUSTOM PROCEDURE TRAY) ×2
PACK EP LF (CUSTOM PROCEDURE TRAY) ×1 IMPLANT
PAD DEFIB LIFELINK (PAD) ×3 IMPLANT
PATCH CARTO3 (PAD) ×3 IMPLANT
SHEATH AGILIS NXT 8.5F 71CM (SHEATH) ×6 IMPLANT
SHEATH AVANTI 11F 11CM (SHEATH) ×3 IMPLANT
SHEATH PINNACLE 7F 10CM (SHEATH) ×3 IMPLANT
SHEATH PINNACLE 8F 10CM (SHEATH) ×6 IMPLANT
SHEATH PINNACLE 9F 10CM (SHEATH) ×6 IMPLANT
SHIELD RADPAD SCOOP 12X17 (MISCELLANEOUS) ×3 IMPLANT
TUBING SMART ABLATE COOLFLOW (TUBING) ×3 IMPLANT

## 2016-02-23 NOTE — H&P (Signed)
Jacob Meadows presents today with a history of atrial fibrillation and atrial flutter.  Plan for ablation of both arrhythmias today.  He takes Eliquis for anticoagulation and has not missed doses.  He is also on amiodarone.  Risks and benefits explained.  Risks include but are not limited to bleeding, tamponade, stroke, heart block, damage to surrounding organs.  Patient understands the risks and has agreed to the procedure.  Vivia Rosenburg Elberta Fortisamnitz, MD 02/23/2016 7:06 AM

## 2016-02-23 NOTE — Discharge Summary (Signed)
ELECTROPHYSIOLOGY PROCEDURE DISCHARGE SUMMARY    Patient ID: Jacob Meadows,  MRN: [id number], DOB/AGE: 66/01/1951 66 y.o.  Admit date: 02/23/2016 Discharge date: 02/24/16  Primary Care Physician: [name],[name], MD Primary Cardiologist/Electrophysiologist: Der. Graciela HusbandsKlein  Primary Discharge Diagnosis:  1. Persistent AFib, A.Flutter     CHA2DS2Vasc is at least 2 Eliquis  Secondary Discharge Diagnosis:  1. HTN  Procedures This Admission:  1.  Electrophysiology study and radiofrequency catheter ablation on 02/23/15 by Dr Elberta Fortisamnitz.  This study demonstrated:   CONCLUSIONS: 1. Sinus rhythm upon presentation.  2. Successful electrical isolation and anatomical encircling of all four pulmonary veins with radiofrequency current.  3. Cavo-tricuspid isthmus ablation was performed with complete bidirectional isthmus block achieved.  4. No early apparent complications.  Brief HPI: Jacob MerinoHenry W Meadows is a 65 y.o. male with a history of presitent atrial fibrillation.  They have failed medical therapy with amiodarone. Risks, benefits, and alternatives to catheter ablation of atrial fibrillation were reviewed with the patient who wished to proceed.  The patient underwent Cardiac CT no LAA thrombus.    Hospital Course:  The patient was admitted and underwent EPS/RFCA of atrial fibrillation with details as outlined above.  They were monitored on telemetry overnight which demonstrated SR.  Groin was without complication on the day of discharge.  The patient was examined by Dr. Elberta Fortisamnitz and considered to be stable for discharge.  Wound care and restrictions were reviewed with the patient.  The patient Jacob Meadows be seen back by Rudi Cocoonna Carroll, NP in 4 weeks and Dr. Elberta Fortisamnitz in 12 weeks for post ablation follow up.    Physical Exam: Filed Vitals:   02/23/16 1730 02/23/16 1800 02/23/16 1921 02/24/16 0537  BP: 129/70 156/81 148/82 139/74  Pulse: 87 92 94 85  Temp:   98.1 F (36.7 C) 98 F (36.7 C)  TempSrc:   Oral  Oral  Resp: 13 11 20 17   Height:      Weight:    187 lb 6.3 oz (85 kg)  SpO2: 96% 100% 99% 98%    GEN- The patient is well appearing, alert and oriented x 3 today.   HEENT: normocephalic, atraumatic; sclera clear, conjunctiva pink; hearing intact; oropharynx clear; neck supple  Lungs- Clear to ausculation bilaterally, normal work of breathing.  No wheezes, rales, rhonchi Heart- Regular rate and rhythm, no murmurs, rubs or gallops  GI- soft, non-tender, non-distended, bowel sounds present  Extremities- no clubbing, cyanosis, or edema, groin without hematoma/bruit MS- no significant deformity or atrophy Skin- warm and dry, no rash or lesion Psych- euthymic mood, full affect Neuro- strength and sensation are intact   Labs:   Lab Results  Component Value Date   WBC 7.8 02/17/2016   HGB 13.3 02/17/2016   HCT 39.8 02/17/2016   MCV 93.4 02/17/2016   PLT 267 02/17/2016     Recent Labs Lab 02/17/16 0839  NA 140  K 4.2  CL 105  CO2 26  BUN 15  CREATININE 1.04  CALCIUM 9.2  GLUCOSE 130*     Discharge Medications:    Medication List    ASK your doctor about these medications        amiodarone 200 MG tablet  Commonly known as:  PACERONE  Take 1-2 tablets (200-400 mg total) by mouth 2 (two) times daily. 2 tabs bid for 1 week, then 1 tab bid for 1 week, then 1 tab daily     apixaban 5 MG Tabs tablet  Commonly known as:  ELIQUIS  Take 1 tablet (5 mg total) by mouth 2 (two) times daily.     carvedilol 12.5 MG tablet  Commonly known as:  COREG  Take 1 tablet (12.5 mg total) by mouth 2 (two) times daily.     CENTRUM SILVER ADULT 50+ PO  Take 1 tablet by mouth daily.     ibuprofen 200 MG tablet  Commonly known as:  ADVIL,MOTRIN  Take 200 mg by mouth every 6 (six) hours as needed for moderate pain.     labetalol 200 MG tablet  Commonly known as:  NORMODYNE  Take 1 tablet (200 mg total) by mouth 2 (two) times daily.     losartan 100 MG tablet  Commonly known as:   COZAAR  Take 1 tablet (100 mg total) by mouth daily.        Disposition: Home  Follow-up Information    Follow up with [facility] On 03/22/2016.   Specialty:  Cardiology   Why:  9:30AM   Contact information:   [address] [id number] mc [place] [address] [phone]      Follow up with Avelina Mcclurkin Jorja Loa, MD On 05/23/2016.   Specialty:  Cardiology   Why:  9:30AM   Contact information:   [address] [place] [address] [phone]       Duration of Discharge Encounter: Greater than 30 minutes including physician time.  Norma Fredrickson, PA-C 02/24/2016 8:13 AM    I have seen and examined this patient with Francis Dowse.  Agree with above, note added to reflect my findings.  On exam, regular rhythm, no murmurs, lungs clear.  Had ablation for atrial fibrillation and flutter.  Tolerated well.  Plan for continued amiodaorne in periop period.  Discussed ambulation and precautions post procedure.  Plan for discharge today.    Kanav Kazmierczak M. Mitesh Rosendahl MD 02/24/2016 9:47 AM

## 2016-02-23 NOTE — Transfer of Care (Signed)
Immediate Anesthesia Transfer of Care Note  Patient: Barbette MerinoHenry W Lothrop  Procedure(s) Performed: Procedure(s): Atrial Fibrillation Ablation (N/A)  Patient Location: PACU and Cath Lab  Anesthesia Type:General  Level of Consciousness: awake, alert , oriented and patient cooperative  Airway & Oxygen Therapy: Patient Spontanous Breathing and Patient connected to nasal cannula oxygen  Post-op Assessment: Report given to RN and Post -op Vital signs reviewed and stable  Post vital signs: Reviewed and stable  Last Vitals:  Filed Vitals:   02/23/16 0540  BP: 141/87  Pulse: 71  Temp: 36.7 C    Complications: No apparent anesthesia complications

## 2016-02-23 NOTE — Anesthesia Procedure Notes (Signed)
Procedure Name: Intubation Date/Time: 02/23/2016 7:54 AM Performed by: Fabian NovemberSOLHEIM, Marca Gadsby SALOMAN Pre-anesthesia Checklist: Patient identified, Patient being monitored, Timeout performed, Emergency Drugs available and Suction available Patient Re-evaluated:Patient Re-evaluated prior to inductionOxygen Delivery Method: Circle System Utilized Preoxygenation: Pre-oxygenation with 100% oxygen Intubation Type: IV induction Ventilation: Mask ventilation with difficulty, Two handed mask ventilation required and Oral airway inserted - appropriate to patient size Laryngoscope Size: Miller and 3 Grade View: Grade IV Tube type: Oral Tube size: 8.0 mm Number of attempts: 2 Airway Equipment and Method: Stylet and Bougie stylet Placement Confirmation: positive ETCO2 and breath sounds checked- equal and bilateral Secured at: 23 cm Tube secured with: Tape Dental Injury: Teeth and Oropharynx as per pre-operative assessment  Difficulty Due To: Difficulty was unanticipated and Difficult Airway- due to anterior larynx Future Recommendations: Recommend- induction with short-acting agent, and alternative techniques readily available Comments: First DL with Miller 3 resulted in inability to obtain view, larynx very anterior. Pt repositioned and no improvement noted with second DL. Bougie stylet atraumatically inserted with rings felt with advancement down trachea. ETT gently advanced over Bougie with resultant AOI. Would recommend glidescope for future intubations.

## 2016-02-23 NOTE — Anesthesia Postprocedure Evaluation (Signed)
Anesthesia Post Note  Patient: Jacob Meadows  Procedure(s) Performed: Procedure(s) (LRB): Atrial Fibrillation Ablation (N/A)  Patient location during evaluation: PACU Anesthesia Type: General Level of consciousness: awake and alert Pain management: pain level controlled Vital Signs Assessment: post-procedure vital signs reviewed and stable Respiratory status: spontaneous breathing, nonlabored ventilation, respiratory function stable and patient connected to nasal cannula oxygen Cardiovascular status: blood pressure returned to baseline and stable Postop Assessment: no signs of nausea or vomiting Anesthetic complications: no    Last Vitals:  Filed Vitals:   02/23/16 1210 02/23/16 1225  BP: 106/60 120/67  Pulse: 72 76  Temp:    Resp: 11 14    Last Pain: There were no vitals filed for this visit.               Porshia Blizzard S

## 2016-02-23 NOTE — Progress Notes (Addendum)
Left femoral venous and right femoral venous sheaths removed and pressure held for 15 minutes to each groin.  Both groins level zero, distal pedal pulses intact. Patient verbalizes instructions of of down time. No apparent complications of groin holds.Bedrest begins at 1300 for 6 hours.

## 2016-02-23 NOTE — Discharge Instructions (Signed)
No driving for 4 days. No lifting over 5 lbs for 1 week. No vigorous or sexual activity for 1 week. You may return to work on 03/02/16. Keep procedure site clean & dry. If you notice increased pain, swelling, bleeding or pus, call/return!  You may shower, but no soaking baths/hot tubs/pools for 1 week.     You have an appointment set up with the [facility].  Multiple studies have shown that being followed by a dedicated atrial fibrillation clinic in addition to the standard care you receive from your other physicians improves health. We believe that enrollment in the atrial fibrillation clinic will allow us to better care for you.   The phone number to the [facility] Clinic is [phone]. The clinic is staffed Monday through Friday from 8:30am to 5pm.  Parking Directions: The clinic is located in the [facility]. 1)From [address] turn on to [address] and go to the 3rd entrance  (Heart and Vascular entrance) on the right. 2)Look to the right for Heart &Vascular Parking Garage. 3)A code for the entrance is required please call the clinic to receive this.   4)Take the elevators to the 1st floor. Registration is in the room with the glass walls at the end of the hallway.  If you have any trouble parking or locating the clinic, please dont hesitate to call [phone].

## 2016-02-23 NOTE — Progress Notes (Signed)
Family in to visit.

## 2016-02-23 NOTE — Progress Notes (Signed)
Patient complained of "feeling like heart racing" Rhonda Barrett PA notified, Pacerone 200 mg given per order, see emar. No further complaints voiced at this time. BL groin sites remains a level 0 with dressings in place C/D/I.

## 2016-02-24 ENCOUNTER — Encounter (HOSPITAL_COMMUNITY): Payer: Self-pay | Admitting: Cardiology

## 2016-02-24 DIAGNOSIS — Z7901 Long term (current) use of anticoagulants: Secondary | ICD-10-CM | POA: Diagnosis not present

## 2016-02-24 DIAGNOSIS — I481 Persistent atrial fibrillation: Secondary | ICD-10-CM | POA: Diagnosis not present

## 2016-02-24 DIAGNOSIS — I483 Typical atrial flutter: Secondary | ICD-10-CM | POA: Diagnosis not present

## 2016-02-24 DIAGNOSIS — I480 Paroxysmal atrial fibrillation: Secondary | ICD-10-CM | POA: Diagnosis not present

## 2016-02-24 DIAGNOSIS — I10 Essential (primary) hypertension: Secondary | ICD-10-CM | POA: Diagnosis not present

## 2016-02-24 MED ORDER — CARVEDILOL 25 MG PO TABS: 25.0000 mg | ORAL_TABLET | Freq: Two times a day (BID) | ORAL | Status: DC

## 2016-02-24 MED ORDER — AMIODARONE HCL 200 MG PO TABS: 200.0000 mg | ORAL_TABLET | Freq: Every day | ORAL | Status: DC

## 2016-03-22 ENCOUNTER — Ambulatory Visit (HOSPITAL_COMMUNITY)
Admission: RE | Admit: 2016-03-22 | Discharge: 2016-03-22 | Disposition: A | Discharge status: Home / Self Care | Payer: BLUE CROSS/BLUE SHIELD | Source: Ambulatory Visit | Attending: Nurse Practitioner | Admitting: Nurse Practitioner

## 2016-03-22 ENCOUNTER — Encounter (HOSPITAL_COMMUNITY): Payer: Self-pay | Admitting: Nurse Practitioner

## 2016-03-22 ENCOUNTER — Other Ambulatory Visit: Payer: Self-pay

## 2016-03-22 VITALS — BP 148/92 | HR 66 | Ht 69.5 in | Wt 188.2 lb

## 2016-03-22 DIAGNOSIS — I4891 Unspecified atrial fibrillation: Secondary | ICD-10-CM | POA: Diagnosis present

## 2016-03-22 DIAGNOSIS — I481 Persistent atrial fibrillation: Secondary | ICD-10-CM

## 2016-03-22 DIAGNOSIS — Z8249 Family history of ischemic heart disease and other diseases of the circulatory system: Secondary | ICD-10-CM | POA: Diagnosis not present

## 2016-03-22 DIAGNOSIS — I4819 Other persistent atrial fibrillation: Secondary | ICD-10-CM

## 2016-03-22 DIAGNOSIS — I10 Essential (primary) hypertension: Secondary | ICD-10-CM | POA: Insufficient documentation

## 2016-03-22 DIAGNOSIS — I480 Paroxysmal atrial fibrillation: Secondary | ICD-10-CM | POA: Diagnosis not present

## 2016-03-22 DIAGNOSIS — Z79899 Other long term (current) drug therapy: Secondary | ICD-10-CM | POA: Diagnosis not present

## 2016-03-22 DIAGNOSIS — Z7901 Long term (current) use of anticoagulants: Secondary | ICD-10-CM | POA: Diagnosis not present

## 2016-03-22 NOTE — Progress Notes (Signed)
Patient ID: Jacob Meadows, male   DOB: 03/26/1951, 65 y.o.   MRN: [id number]     Primary Care Physician: Jacob Meadows Referring Physician: Dr. Reuel Meadows   Jacob Meadows is a 65 y.o. male with a h/o persistent afib that is in the afib clinic today for f/u of ablation, 02/23/16.  Pt states that he has some irregular heart beat x 10 days after the procedure and did not feel well,  but has been feeling great for the last 10 days without any irregular heartbeat. He denies any swallowing difficulties or groin issues.Continues on apixaban without interruption, chadsvasc score of 2, and knows the importance of taking without missed doses.   Today, he denies symptoms of palpitations, chest pain, shortness of breath, orthopnea, PND, lower extremity edema, dizziness, presyncope, syncope, or neurologic sequela. The patient is tolerating medications without difficulties and is otherwise without complaint today.   Past Medical History  Diagnosis Date  . Essential hypertension 11/24/2015  . Persistent atrial fibrillation (HCC) 11/24/2015  . Sleep-disordered breathing 11/24/2015  . Nonspecific abnormal electrocardiogram (ECG) low voltage limb leads 11/24/2015  . Atrial flutter (HCC)     "just the last week or so" (01/29/2016)  . High cholesterol    Past Surgical History  Procedure Laterality Date  . Cardioversion N/A 12/16/2015    Procedure: CARDIOVERSION;  Surgeon: Jacob NoseKenneth C Hilty, Meadows;  [facility];  Service: Cardiovascular;  Laterality: N/A;  . Tonsillectomy  1957  . Cardioversion  12/2015    "done in ER"  . Atrial fibrillation ablation  02/23/2016  . Atrial flutter ablation  02/23/2016  . Electrophysiologic study N/A 02/23/2016    Procedure: Atrial Fibrillation Ablation;  Surgeon: Jacob Jorja LoaMartin Camnitz, Meadows;  Location: [facility] INVASIVE CV LAB;  Service: Cardiovascular;  Laterality: N/A;    Current Outpatient Prescriptions  Medication Sig Dispense Refill  . amiodarone (PACERONE) 200 MG tablet Take 1 tablet  (200 mg total) by mouth daily. 30 tablet 3  . apixaban (ELIQUIS) 5 MG TABS tablet Take 1 tablet (5 mg total) by mouth 2 (two) times daily. 60 tablet 6  . carvedilol (COREG) 25 MG tablet Take 1 tablet (25 mg total) by mouth 2 (two) times daily. 60 tablet 3  . ibuprofen (ADVIL,MOTRIN) 200 MG tablet Take 200 mg by mouth every 6 (six) hours as needed for moderate pain.    Marland Kitchen. losartan (COZAAR) 100 MG tablet Take 1 tablet (100 mg total) by mouth daily. 30 tablet 6  . Multiple Vitamins-Minerals (CENTRUM SILVER ADULT 50+ PO) Take 1 tablet by mouth daily.     No current facility-administered medications for this encounter.    No Known Allergies  Social History   Social History  . Marital Status: Married    Spouse Name: N/A  . Number of Children: N/A  . Years of Education: N/A   Occupational History  . Not on file.   Social History Main Topics  . Smoking status: Current Some Day Smoker -- 15 years    Types: Cigars  . Smokeless tobacco: Never Used  . Alcohol Use: 4.2 - 5.4 oz/week    4 Glasses of wine, 2 Shots of liquor, 1-3 Standard drinks or equivalent per week  . Drug Use: No  . Sexual Activity: Yes   Other Topics Concern  . Not on file   Social History Narrative    Family History  Problem Relation Age of Onset  . Alzheimer's disease Mother   . Congestive Heart Failure Father   .  Atrial fibrillation Father     ROS- All systems are reviewed and negative except as per the HPI above  Physical Exam: Filed Vitals:   03/22/16 0939  BP: 148/92  Pulse: 66  Height: 5' 9.5" (1.765 m)  Weight: 188 lb 3.2 oz (85.367 kg)    GEN- The patient is well appearing, alert and oriented x 3 today.   Head- normocephalic, atraumatic Eyes-  Sclera clear, conjunctiva pink Ears- hearing intact Oropharynx- clear Neck- supple, no JVP Lymph- no cervical lymphadenopathy Lungs- Clear to ausculation bilaterally, normal work of breathing Heart- Regular rate and rhythm, no murmurs, rubs or  gallops, PMI not laterally displaced GI- soft, NT, ND, + BS Extremities- no clubbing, cyanosis, or edema MS- no significant deformity or atrophy Skin- no rash or lesion Psych- euthymic mood, full affect Neuro- strength and sensation are intact  EKG-SR with first degree av block, otherwise normal EKG. PR int 224 ms, qrs int 88 ms, qtc 427 ms Epic records reviewed  Assessment and Plan: 1. PAF Doing well post ablation Continue amiodarone for now Continue apixiban  2. HTN Stable Continue losartan   F/u with Dr. Elberta Meadows as scheduled 05/23/16 afib clinic as needed

## 2016-04-03 ENCOUNTER — Ambulatory Visit (HOSPITAL_BASED_OUTPATIENT_CLINIC_OR_DEPARTMENT_OTHER): Payer: BLUE CROSS/BLUE SHIELD | Attending: Internal Medicine | Admitting: Cardiology

## 2016-04-03 VITALS — Ht 70.0 in | Wt 182.0 lb

## 2016-04-03 DIAGNOSIS — G4733 Obstructive sleep apnea (adult) (pediatric): Secondary | ICD-10-CM | POA: Diagnosis not present

## 2016-04-03 DIAGNOSIS — I10 Essential (primary) hypertension: Secondary | ICD-10-CM | POA: Insufficient documentation

## 2016-04-03 DIAGNOSIS — I481 Persistent atrial fibrillation: Secondary | ICD-10-CM | POA: Diagnosis not present

## 2016-04-03 DIAGNOSIS — R5383 Other fatigue: Secondary | ICD-10-CM | POA: Diagnosis not present

## 2016-04-03 DIAGNOSIS — R0683 Snoring: Secondary | ICD-10-CM | POA: Insufficient documentation

## 2016-04-03 DIAGNOSIS — I4819 Other persistent atrial fibrillation: Secondary | ICD-10-CM

## 2016-04-08 NOTE — Progress Notes (Signed)
Subjective:     Patient ID: Jacob Meadows, male   DOB: 08/26/1951, 65 y.o.   MRN: [id number]  HPI   Review of Systems     Objective:   Physical Exam     Assessment:      Plan:          This encounter was created in error - please disregard.

## 2016-04-18 NOTE — Procedures (Signed)
   Patient Name: Jacob Meadows, Jacob Meadows MRN: [id number] Study Date: 04/03/2016 Gender: Male D.O.B: [birth year] Age (years): 65 Referring Provider: Sherryl MangesSteven Klein Interpreting Physician: Armanda Magicraci Turner MD, ABSM RPSGT: Cherylann Parrubili, Fred  BMI: 26 Height (inches): 70 Neck Size: 17.50 Weight (lbs): 182 CLINICAL INFORMATION Sleep Study Type: NPSG Indication for sleep study: Fatigue, Hypertension, OSA, Snoring, Witnessed Apneas   SLEEP STUDY TECHNIQUE As per the AASM Manual for the Scoring of Sleep and Associated Events v2.3 (April 2016) with a hypopnea requiring 4% desaturations. The channels recorded and monitored were frontal, central and occipital EEG, electrooculogram (EOG), submentalis EMG (chin), nasal and oral airflow, thoracic and abdominal wall motion, anterior tibialis EMG, snore microphone, electrocardiogram, and pulse oximetry.  MEDICATIONS Patient's medications include: Reviewed in the chart. Medications self-administered by patient during sleep study : No sleep medicine administered.  SLEEP ARCHITECTURE The study was initiated at 10:21:46 PM and ended at 4:33:38 AM. Sleep onset time was 11.3 minutes and the sleep efficiency was reduced at 81.6%. The total sleep time was 303.5 minutes. Stage REM latency was 81.5 minutes. The patient spent 3.13% of the night in stage N1 sleep, 81.22% in stage N2 sleep, 0.33% in stage N3 and 15.32% in REM. Alpha intrusion was absent. Supine sleep was 78.91%.  RESPIRATORY PARAMETERS The overall apnea/hypopnea index (AHI) was 6.7 per hour. There were 6 total apneas, including 2 obstructive, 2 central and 2 mixed apneas. There were 28 hypopneas and 0 RERAs. The AHI during Stage REM sleep was 16.8 per hour. AHI while supine was 7.3 per hour. The mean oxygen saturation was 95.36%. The minimum SpO2 during sleep was 90.00%. Loud snoring was noted during this study.  CARDIAC DATA The 2 lead EKG demonstrated sinus rhythm. The mean heart rate was 59.12 beats per  minute. Other EKG findings include: None.  LEG MOVEMENT DATA The total PLMS were 132 with a resulting PLMS index of 26.10. Associated arousal with leg movement index was 0.4 .  IMPRESSIONS - Mild obstructive sleep apnea occurred during this study (AHI = 6.7/h). - No significant central sleep apnea occurred during this study (CAI = 0.4/h). - The patient had minimal or no oxygen desaturation during the study (Min O2 = 90.00%) - The patient snored with Loud snoring volume. - No cardiac abnormalities were noted during this study. - Moderate periodic limb movements of sleep occurred during the study. No significant associated arousals.  DIAGNOSIS - Obstructive Sleep Apnea (327.23 [G47.33 ICD-10])  RECOMMENDATIONS - Positional therapy avoiding supine position during sleep. - Very mild obstructive sleep apnea. Return to discuss treatment options. - Avoid alcohol, sedatives and other CNS depressants that may worsen sleep apnea and disrupt normal sleep architecture. - Sleep hygiene should be reviewed to assess factors that may improve sleep quality. - Weight management and regular exercise should be initiated or continued if appropriate.   Armanda Magicraci Turner Diplomate, American Board of Sleep Medicine  ELECTRONICALLY SIGNED ON:  04/18/2016, 10:13 AM [facility] PH: [phone]   FX: [phone] ACCREDITED BY THE AMERICAN ACADEMY OF SLEEP MEDICINE

## 2016-04-25 NOTE — Progress Notes (Signed)
Subjective:     Patient ID: Jacob Meadows, male   DOB: 04/11/1951, 65 y.o.   MRN: [id number]  HPI   Review of Systems     Objective:   Physical Exam     Assessment:        Plan:

## 2016-05-09 ENCOUNTER — Other Ambulatory Visit: Payer: Self-pay

## 2016-05-09 ENCOUNTER — Telehealth: Payer: Self-pay | Admitting: Internal Medicine

## 2016-05-09 ENCOUNTER — Encounter (HOSPITAL_COMMUNITY): Payer: Self-pay | Admitting: Emergency Medicine

## 2016-05-09 ENCOUNTER — Emergency Department (HOSPITAL_COMMUNITY)
Admission: EM | Admit: 2016-05-09 | Discharge: 2016-05-09 | Disposition: A | Discharge status: Home / Self Care | Payer: BLUE CROSS/BLUE SHIELD | Attending: Emergency Medicine | Admitting: Emergency Medicine

## 2016-05-09 ENCOUNTER — Emergency Department (HOSPITAL_COMMUNITY): Payer: BLUE CROSS/BLUE SHIELD

## 2016-05-09 DIAGNOSIS — I10 Essential (primary) hypertension: Secondary | ICD-10-CM | POA: Insufficient documentation

## 2016-05-09 DIAGNOSIS — I4891 Unspecified atrial fibrillation: Secondary | ICD-10-CM | POA: Diagnosis not present

## 2016-05-09 DIAGNOSIS — R079 Chest pain, unspecified: Secondary | ICD-10-CM | POA: Diagnosis not present

## 2016-05-09 DIAGNOSIS — I480 Paroxysmal atrial fibrillation: Secondary | ICD-10-CM | POA: Insufficient documentation

## 2016-05-09 DIAGNOSIS — R072 Precordial pain: Secondary | ICD-10-CM | POA: Diagnosis not present

## 2016-05-09 DIAGNOSIS — Z79899 Other long term (current) drug therapy: Secondary | ICD-10-CM | POA: Insufficient documentation

## 2016-05-09 DIAGNOSIS — F17210 Nicotine dependence, cigarettes, uncomplicated: Secondary | ICD-10-CM | POA: Insufficient documentation

## 2016-05-09 DIAGNOSIS — Z7901 Long term (current) use of anticoagulants: Secondary | ICD-10-CM | POA: Insufficient documentation

## 2016-05-09 DIAGNOSIS — R0789 Other chest pain: Secondary | ICD-10-CM

## 2016-05-09 LAB — CBC
HEMATOCRIT: 41.5 % (ref 39.0–52.0)
HEMOGLOBIN: 14 g/dL (ref 13.0–17.0)
MCH: 31 pg (ref 26.0–34.0)
MCHC: 33.7 g/dL (ref 30.0–36.0)
MCV: 92 fL (ref 78.0–100.0)
Platelets: 244 10*3/uL (ref 150–400)
RBC: 4.51 MIL/uL (ref 4.22–5.81)
RDW: 13.1 % (ref 11.5–15.5)
WBC: 7.6 10*3/uL (ref 4.0–10.5)

## 2016-05-09 LAB — PROTIME-INR
INR: 1.3 (ref 0.00–1.49)
PROTHROMBIN TIME: 16.3 s — AB (ref 11.6–15.2)

## 2016-05-09 LAB — BASIC METABOLIC PANEL
Anion gap: 9 (ref 5–15)
BUN: 11 mg/dL (ref 6–20)
CALCIUM: 9.3 mg/dL (ref 8.9–10.3)
CO2: 24 mmol/L (ref 22–32)
CREATININE: 1.02 mg/dL (ref 0.61–1.24)
Chloride: 106 mmol/L (ref 101–111)
Glucose, Bld: 163 mg/dL — ABNORMAL HIGH (ref 65–99)
Potassium: 4.1 mmol/L (ref 3.5–5.1)
Sodium: 139 mmol/L (ref 135–145)

## 2016-05-09 LAB — URINALYSIS, ROUTINE W REFLEX MICROSCOPIC
BILIRUBIN URINE: NEGATIVE
GLUCOSE, UA: NEGATIVE mg/dL
HGB URINE DIPSTICK: NEGATIVE
Ketones, ur: NEGATIVE mg/dL
Leukocytes, UA: NEGATIVE
NITRITE: NEGATIVE
PH: 7 (ref 5.0–8.0)
Protein, ur: NEGATIVE mg/dL
SPECIFIC GRAVITY, URINE: 1.018 (ref 1.005–1.030)

## 2016-05-09 LAB — TROPONIN I
Troponin I: <0.03 ng/mL (ref <0.031)
Troponin I: <0.03 ng/mL (ref <0.031)

## 2016-05-09 MED ORDER — SODIUM CHLORIDE 0.9 % IV SOLN
INTRAVENOUS | Status: DC
  Administered 2016-05-09: 09:00:00 via INTRAVENOUS

## 2016-05-09 NOTE — Discharge Instructions (Signed)
Atrial Fibrillation °Atrial fibrillation is a type of heartbeat that is irregular or fast (rapid). If you have this condition, your heart keeps quivering in a weird (chaotic) way. This condition can make it so your heart cannot pump blood normally. Having this condition gives a person more risk for stroke, heart failure, and other heart problems. There are different types of atrial fibrillation. Talk with your doctor to learn about the type that you have. °HOME CARE °· Take over-the-counter and prescription medicines only as told by your doctor. °· If your doctor prescribed a blood-thinning medicine, take it exactly as told. Taking too much of it can cause bleeding. If you do not take enough of it, you will not have the protection that you need against stroke and other problems. °· Do not use any tobacco products. These include cigarettes, chewing tobacco, and e-cigarettes. If you need help quitting, ask your doctor. °· If you have apnea (obstructive sleep apnea), manage it as told by your doctor. °· Do not drink alcohol. °· Do not drink beverages that have caffeine. These include coffee, soda, and tea. °· Maintain a healthy weight. Do not use diet pills unless your doctor says they are safe for you. Diet pills may make heart problems worse. °· Follow diet instructions as told by your doctor. °· Exercise regularly as told by your doctor. °· Keep all follow-up visits as told by your doctor. This is important. °GET HELP IF: °· You notice a change in the speed, rhythm, or strength of your heartbeat. °· You are taking a blood-thinning medicine and you notice more bruising. °· You get tired more easily when you move or exercise. °GET HELP RIGHT AWAY IF: °· You have pain in your chest or your belly (abdomen). °· You have sweating or weakness. °· You feel sick to your stomach (nauseous). °· You notice blood in your throw up (vomit), poop (stool), or pee (urine). °· You are short of breath. °· You suddenly have swollen feet  and ankles. °· You feel dizzy. °· Your suddenly get weak or numb in your face, arms, or legs, especially if it happens on one side of your body. °· You have trouble talking, trouble understanding, or both. °· Your face or your eyelid droops on one side. °These symptoms may be an emergency. Do not wait to see if the symptoms will go away. Get medical help right away. Call your local emergency services (911 in the [place] Do not drive yourself to the hospital. °  °This information is not intended to replace advice given to you by your health care provider. Make sure you discuss any questions you have with your health care provider. °  °Document Released: 08/16/2008 Document Revised: 07/29/2015 Document Reviewed: 03/04/2015 °Elsevier Interactive Patient Education ©2016 Elsevier Inc. ° °

## 2016-05-09 NOTE — Telephone Encounter (Signed)
FYI: Mrs. Christella HartiganJacobs is calling just to let Dr. Graciela HusbandsKlein Know that Mr. Christella HartiganJacobs was taken to ED for symptoms of a heart attack.  Thanks

## 2016-05-09 NOTE — Telephone Encounter (Signed)
Per Trish--ED will contact cardiology, wife aware

## 2016-05-09 NOTE — ED Provider Notes (Signed)
CSN: [id number]     Arrival date & time 05/09/16  0831 History   First MD Initiated Contact with Patient 05/09/16 [phone]     Chief Complaint  Patient presents with  . Chest Pain   PT IS A 65 YO WM WITH A HX OF A.FIB.  HE HAD AN ABLATION ON 02/23/2016.  PT HAS A CHADSVASC SCORE OF 2 AND HAS BEEN TAKING ALL OF HIS MEDS AS DIRECTED.  THE PT SAID THAT HE DEVELOPED SOME CP AND SOB LAST NIGHT AND FELT LIKE HE WENT INTO A.FIB.  THE PT SAID THAT HE FEELS WELL NOW.  THE PT SAID THAT HE CALLED HIS CARDIOLOGIST PRIOR TO COMING HERE TODAY.  (Consider location/radiation/quality/duration/timing/severity/associated sxs/prior Treatment) Patient is a 65 y.o. male presenting with chest pain. The history is provided by the patient.  Chest Pain Pain location:  Substernal area Pain quality: aching   Pain radiates to:  Does not radiate Pain radiates to the back: no   Pain severity:  Mild Onset quality:  Sudden Timing:  Constant Progression:  Resolved Chronicity:  Recurrent Associated symptoms: shortness of breath     Past Medical History  Diagnosis Date  . Essential hypertension 11/24/2015  . Persistent atrial fibrillation (HCC) 11/24/2015  . Sleep-disordered breathing 11/24/2015  . Nonspecific abnormal electrocardiogram (ECG) low voltage limb leads 11/24/2015  . Atrial flutter (HCC)     "just the last week or so" (01/29/2016)  . High cholesterol    Past Surgical History  Procedure Laterality Date  . Cardioversion N/A 12/16/2015    Procedure: CARDIOVERSION;  Surgeon: Chrystie NoseKenneth C Hilty, MD;  [facility];  Service: Cardiovascular;  Laterality: N/A;  . Tonsillectomy  1957  . Cardioversion  12/2015    "done in ER"  . Atrial fibrillation ablation  02/23/2016  . Atrial flutter ablation  02/23/2016  . Electrophysiologic study N/A 02/23/2016    Procedure: Atrial Fibrillation Ablation;  Surgeon: Will Jorja LoaMartin Camnitz, MD;  Location: [facility] INVASIVE CV LAB;  Service: Cardiovascular;  Laterality: N/A;   Family History   Problem Relation Age of Onset  . Alzheimer's disease Mother   . Congestive Heart Failure Father   . Atrial fibrillation Father    Social History  Substance Use Topics  . Smoking status: Current Some Day Smoker -- 15 years    Types: Cigars  . Smokeless tobacco: Never Used  . Alcohol Use: 4.2 - 5.4 oz/week    4 Glasses of wine, 2 Shots of liquor, 1-3 Standard drinks or equivalent per week    Review of Systems  Respiratory: Positive for shortness of breath.   Cardiovascular: Positive for chest pain.  All other systems reviewed and are negative.     Allergies  Review of patient's allergies indicates no known allergies.  Home Medications   Prior to Admission medications   Medication Sig Start Date End Date Taking? Authorizing Provider  amiodarone (PACERONE) 200 MG tablet Take 1 tablet (200 mg total) by mouth daily. 02/24/16  Yes Renee Norberto SorensonLynn Ursuy, PA-C  apixaban (ELIQUIS) 5 MG TABS tablet Take 1 tablet (5 mg total) by mouth 2 (two) times daily. 12/23/15  Yes Duke SalviaSteven C Klein, MD  carvedilol (COREG) 25 MG tablet Take 1 tablet (25 mg total) by mouth 2 (two) times daily. 02/24/16  Yes Renee Norberto SorensonLynn Ursuy, PA-C  ibuprofen (ADVIL,MOTRIN) 200 MG tablet Take 200 mg by mouth every 6 (six) hours as needed for moderate pain.   Yes Historical Provider, MD  losartan (COZAAR) 100 MG tablet Take  1 tablet (100 mg total) by mouth daily. 01/15/16  Yes Renee Norberto Sorenson, PA-C  Multiple Vitamins-Minerals (CENTRUM SILVER ADULT 50+ PO) Take 1 tablet by mouth daily.   Yes Historical Provider, MD   BP 151/95 mmHg  Pulse 62  Temp(Src) 98.8 F (37.1 C) (Oral)  Resp 10  Ht  (1.778 m)  Wt 184 lb (83.462 kg)  BMI 26.40 kg/m2  SpO2 97% Physical Exam  Constitutional: He is oriented to person, place, and time. He appears well-developed and well-nourished.  HENT:  Head: Normocephalic and atraumatic.  Right Ear: External ear normal.  Left Ear: External ear normal.  Nose: Nose normal.  Mouth/Throat:  Oropharynx is clear and moist.  Eyes: Conjunctivae and EOM are normal. Pupils are equal, round, and reactive to light.  Neck: Normal range of motion. Neck supple.  Cardiovascular: Normal rate, regular rhythm, normal heart sounds and intact distal pulses.   Pulmonary/Chest: Effort normal and breath sounds normal.  Abdominal: Soft. Bowel sounds are normal.  Musculoskeletal: Normal range of motion.  Neurological: He is alert and oriented to person, place, and time.  Skin: Skin is warm and dry.  Psychiatric: He has a normal mood and affect. His behavior is normal. Judgment and thought content normal.  Nursing note and vitals reviewed.   ED Course  Procedures (including critical care time) Labs Review Labs Reviewed  BASIC METABOLIC PANEL - Abnormal; Notable for the following:    Glucose, Bld 163 (*)    All other components within normal limits  PROTIME-INR - Abnormal; Notable for the following:    Prothrombin Time 16.3 (*)    All other components within normal limits  CBC  TROPONIN I  URINALYSIS, ROUTINE W REFLEX MICROSCOPIC (NOT AT [facility])  TROPONIN I    Imaging Review Dg Chest 2 View  05/09/2016  CLINICAL DATA:  65 year old male with chest pain since 20/2 30 hours last night. EXAM: CHEST  2 VIEW COMPARISON:  Chest CTA 02/17/2016 and earlier. FINDINGS: Stable cardiac size at the upper limits of normal to mildly increased. Other mediastinal contours are within normal limits. Visualized tracheal air column is within normal limits. No pneumothorax, pulmonary edema, pleural effusion or confluent pulmonary opacity. No acute osseous abnormality identified. IMPRESSION: No acute cardiopulmonary abnormality. Electronically Signed   By: Odessa Fleming M.D.   On: 05/09/2016 10:03   I have personally reviewed and evaluated these images and lab results as part of my medical decision-making.   EKG Interpretation   Date/Time:  Monday May 09 2016 08:41:00 EDT Ventricular Rate:  63 PR Interval:  192 QRS  Duration: 98 QT Interval:  422 QTC Calculation: 431 R Axis:   40 Text Interpretation:  Normal sinus rhythm with sinus arrhythmia Normal ECG  Confirmed by Blong Busk MD, Meagon Duskin ([id number]) on 05/09/2016 8:57:02 AM      MDM  PT D/W DR. [place].  NO FURTHER INTERVENTION NEEDED AT THIS TIME.  PT TO F/U WITH DR. Graciela Husbands. Final diagnoses:  Atypical chest pain  Paroxysmal atrial fibrillation (HCC)       Jacalyn Lefevre, MD 05/09/16 1325

## 2016-05-09 NOTE — ED Notes (Signed)
Pt c/o chest pain that started last night-- 2/10-- hx afib, ablation, substernal with nausea

## 2016-05-16 ENCOUNTER — Telehealth: Payer: Self-pay | Admitting: Internal Medicine

## 2016-05-16 NOTE — Telephone Encounter (Signed)
I have found the results.  Apparently they were never routed - I have discussed the results with the patient, and he was appreciative of the call and stated verbal understanding. At this time, since everything is so mild, he does not want to make that follow-up with Dr. Mayford Knifeurner.   He will be in to see Dr. Elberta Fortisamnitz next week and if anything changes he will let me know.

## 2016-05-16 NOTE — Telephone Encounter (Signed)
New Message:  [facility],still have not received the results.

## 2016-05-22 NOTE — Progress Notes (Signed)
Electrophysiology Office Note   Date:  05/23/2016   ID:  Jacob Meadows, DOB 03/14/1951, MRN [id number]  PCP:  [name],[name], MD  Primary Electrophysiologist:  Graciela HusbandsKlein   Chief Complaint  Patient presents with  . Follow-up    3 months  . Atrial Fibrillation     History of Present Illness: Jacob Meadows is a 65 y.o. male who presents today for electrophysiology evaluation.  Jacob Meadows is a 65 y.o. male with history of Paroxysmal AFib, HTN, ?OSA pending sleep study.  Had AF ablation on 02/23/16.He is feeling well without any major complaints after ablation. He does say that he is having some anxiety and feelings of palpitations, but when he checks his pulse it is always regular. He has gone into the emergency room with similar feelings, and has noted that his blood pressure is high, but his pulse is always regular and he is in sinus rhythm.   Today, he denies symptoms of palpitations, chest pain, shortness of breath, orthopnea, PND, lower extremity edema, claudication, dizziness, presyncope, syncope, bleeding, or neurologic sequela. He is tolerating the amiodarone well without complaint.  He does continue to have significant fatigue.  His BP has been elevate as an outpatient as well.  Past Medical History  Diagnosis Date  . Essential hypertension 11/24/2015  . Persistent atrial fibrillation (HCC) 11/24/2015  . Sleep-disordered breathing 11/24/2015  . Nonspecific abnormal electrocardiogram (ECG) low voltage limb leads 11/24/2015  . Atrial flutter (HCC)     "just the last week or so" (01/29/2016)  . High cholesterol    Past Surgical History  Procedure Laterality Date  . Cardioversion N/A 12/16/2015    Procedure: CARDIOVERSION;  Surgeon: Chrystie NoseKenneth C Hilty, MD;  [facility];  Service: Cardiovascular;  Laterality: N/A;  . Tonsillectomy  1957  . Cardioversion  12/2015    "done in ER"  . Atrial fibrillation ablation  02/23/2016  . Atrial flutter ablation  02/23/2016  . Electrophysiologic  study N/A 02/23/2016    Procedure: Atrial Fibrillation Ablation;  Surgeon: Porshe Fleagle Jorja LoaMartin Isaac Dubie, MD;  Location: [facility] INV[facility] CV LAB;  Service: Cardiovascular;  Laterality: N/A;     Current Outpatient Prescriptions  Medication Sig Dispense Refill  . amiodarone (PACERONE) 200 MG tablet Take 1 tablet (200 mg total) by mouth daily. 30 tablet 3  . apixaban (ELIQUIS) 5 MG TABS tablet Take 1 tablet (5 mg total) by mouth 2 (two) times daily. 60 tablet 6  . atorvastatin (LIPITOR) 10 MG tablet Take 10 mg by mouth daily.    . carvedilol (COREG) 25 MG tablet Take 1 tablet (25 mg total) by mouth 2 (two) times daily. 60 tablet 3  . ibuprofen (ADVIL,MOTRIN) 200 MG tablet Take 200 mg by mouth every 6 (six) hours as needed for moderate pain.    Marland Kitchen. losartan (COZAAR) 100 MG tablet Take 1 tablet (100 mg total) by mouth daily. 30 tablet 6  . Multiple Vitamins-Minerals (CENTRUM SILVER ADULT 50+ PO) Take 1 tablet by mouth daily.    . valsartan (DIOVAN) 320 MG tablet Take 320 mg by mouth daily.     No current facility-administered medications for this visit.    Allergies:   Review of patient's allergies indicates no known allergies.   Social History:  The patient  reports that he has been smoking Cigars.  He has never used smokeless tobacco. He reports that he drinks about 4.2 - 5.4 oz of alcohol per week. He reports that he does not use illicit drugs.  Family History:  The patient's family history includes Alzheimer's disease in his mother; Atrial fibrillation in his father; Congestive Heart Failure in his father.    ROS:  Please see the history of present illness.   Otherwise, review of systems is positive for fatigue, weakness.   All other systems are reviewed and negative.    PHYSICAL EXAM: VS:  BP 170/92 mmHg  Pulse 61  Ht  (1.778 m)  Wt 188 lb 9.6 oz (85.548 kg)  BMI 27.06 kg/m2 , BMI Body mass index is 27.06 kg/(m^2). GEN: Well nourished, well developed, in no acute distress HEENT: normal Neck:  no JVD, carotid bruits, or masses Cardiac: RRR; no murmurs, rubs, or gallops,no edema  Respiratory:  clear to auscultation bilaterally, normal work of breathing GI: soft, nontender, nondistended, + BS MS: no deformity or atrophy Skin: warm and dry Neuro:  Strength and sensation are intact Psych: euthymic mood, full affect  EKG:  EKG is ordered today. The ekg ordered today shows sinus rhythm, rate 61, PR 210  Recent Labs: 11/24/2015: Magnesium 1.8 01/30/2016: ALT 36; TSH 1.216 05/09/2016: BUN 11; Creatinine, Ser 1.02; Hemoglobin 14.0; Platelets 244; Potassium 4.1; Sodium 139    Lipid Panel  No results found for: CHOL, TRIG, HDL, CHOLHDL, VLDL, LDLCALC, LDLDIRECT   Wt Readings from Last 3 Encounters:  05/23/16 188 lb 9.6 oz (85.548 kg)  05/09/16 184 lb (83.462 kg)  04/03/16 182 lb (82.555 kg)      Other studies Reviewed: Additional studies/ records that were reviewed today include: TTE 11/27/15, SPECT 01/28/16   Review of the above records today demonstrates:   - Left ventricle: The cavity size was normal. Wall thickness was  increased in a pattern of mild LVH. Systolic function was normal.  The estimated ejection fraction was in the range of 55% to 60%.  Wall motion was normal; there were no regional wall motion  abnormalities. - Left atrium: The atrium was mildly dilated.   Nuclear stress EF: 46%.  There was no ST segment deviation noted during stress.  No T wave inversion was noted during stress.  The study is normal.  This is a low risk study.  The left ventricular ejection fraction is mildly decreased (45-54%).  Mild septal hypokinesis.  ASSESSMENT AND PLAN:  1.  Persistent atrial fibrillation/atrial flutter: On Eliquis.  Ablation 02/23/16.  Doing well since that time. He is having palpitations, but with a regular pulse and likely sinus rhythm. It is likely that this is due to anxiety which he endorses. We Shene Maxfield continue current management.  This patients  CHA2DS2-VASc Score and unadjusted Ischemic Stroke Rate (% per year) is equal to 2.2 % stroke rate/year from a score of 2  Above score calculated as 1 point each if present [CHF, HTN, DM, Vascular=MI/PAD/Aortic Plaque, Age if 65-74, or Male] Above score calculated as 2 points each if present [Age > 75, or Stroke/TIA/TE]   2. Hypertension: still with elevated BP.  On beta blockers and ARB's currently. Alfonse Garringer add hydrochlorothiazide to see if this Keshauna Degraffenreid help control his blood pressure. I have asked him to call us back in 3 weeks with blood pressure readings to see if any further changes are necessary.  Current medicines are reviewed at length with the patient today.   The patient does not have concerns regarding his medicines.  The following changes were made today:  HCTZ 25 mg  Labs/ tests ordered today include:  No orders of the defined types were placed in this  encounter.     Disposition:   FU with Brennan Litzinger 1 year  Signed, Berneita Sanagustin Jorja LoaMartin Tran Randle, MD  05/23/2016 9:51 AM     [facility] [address] [place] [address] [phone] (office) [phone] (fax)

## 2016-05-23 ENCOUNTER — Ambulatory Visit (INDEPENDENT_AMBULATORY_CARE_PROVIDER_SITE_OTHER): Payer: BLUE CROSS/BLUE SHIELD | Admitting: Cardiology

## 2016-05-23 ENCOUNTER — Encounter: Payer: Self-pay | Admitting: Cardiology

## 2016-05-23 VITALS — BP 170/92 | HR 61 | Ht 70.0 in | Wt 188.6 lb

## 2016-05-23 DIAGNOSIS — I480 Paroxysmal atrial fibrillation: Secondary | ICD-10-CM

## 2016-05-23 MED ORDER — HYDROCHLOROTHIAZIDE 25 MG PO TABS: 25.0000 mg | ORAL_TABLET | Freq: Every day | ORAL | Status: DC

## 2016-05-23 NOTE — Patient Instructions (Addendum)
Medication Instructions:  Your physician has recommended you make the following change in your medication:  1) STOP Amiodarone 2) START Hydrochlorothiazide 25 mg once daily  Labwork: None ordered  Testing/Procedures: None ordered  Follow-Up: Your physician wants you to follow-up in: 1 year with Dr. Elberta Fortisamnitz.  You will receive a reminder letter in the mail two months in advance. If you don't receive a letter, please call our office to schedule the follow-up appointment.  If you need a refill on your cardiac medications before your next appointment, please call your pharmacy.  Thank you for choosing [facility]!!   Dory HornSherri Lavaya Defreitas, RN [phone]  Any Other Special Instructions Will Be Listed Below (If Applicable). - Please call the office in 2-3 weeks with blood pressure readings.   Hydrochlorothiazide, HCTZ capsules or tablets What is this medicine? HYDROCHLOROTHIAZIDE (hye droe klor oh THYE a zide) is a diuretic. It increases the amount of urine passed, which causes the body to lose salt and water. This medicine is used to treat high blood pressure. It is also reduces the swelling and water retention caused by various medical conditions, such as heart, liver, or kidney disease. This medicine may be used for other purposes; ask your health care provider or pharmacist if you have questions. What should I tell my health care provider before I take this medicine? They need to know if you have any of these conditions: -diabetes -gout -immune system problems, like lupus -kidney disease or kidney stones -liver disease -pancreatitis -small amount of urine or difficulty passing urine -an unusual or allergic reaction to hydrochlorothiazide, sulfa drugs, other medicines, foods, dyes, or preservatives -pregnant or trying to get pregnant -breast-feeding How should I use this medicine? Take this medicine by mouth with a glass of water. Follow the directions on the prescription label.  Take your medicine at regular intervals. Remember that you will need to pass urine frequently after taking this medicine. Do not take your doses at a time of day that will cause you problems. Do not stop taking your medicine unless your doctor tells you to. Talk to your pediatrician regarding the use of this medicine in children. Special care may be needed. Overdosage: If you think you have taken too much of this medicine contact a poison control center or emergency room at once. NOTE: This medicine is only for you. Do not share this medicine with others. What if I miss a dose? If you miss a dose, take it as soon as you can. If it is almost time for your next dose, take only that dose. Do not take double or extra doses. What may interact with this medicine? -cholestyramine -colestipol -digoxin -dofetilide -lithium -medicines for blood pressure -medicines for diabetes -medicines that relax muscles for surgery -other diuretics -steroid medicines like prednisone or cortisone This list may not describe all possible interactions. Give your health care provider a list of all the medicines, herbs, non-prescription drugs, or dietary supplements you use. Also tell them if you smoke, drink alcohol, or use illegal drugs. Some items may interact with your medicine. What should I watch for while using this medicine? Visit your doctor or health care professional for regular checks on your progress. Check your blood pressure as directed. Ask your doctor or health care professional what your blood pressure should be and when you should contact him or her. You may need to be on a special diet while taking this medicine. Ask your doctor. Check with your doctor or health care professional  if you get an attack of severe diarrhea, nausea and vomiting, or if you sweat a lot. The loss of too much body fluid can make it dangerous for you to take this medicine. You may get drowsy or dizzy. Do not drive, use machinery,  or do anything that needs mental alertness until you know how this medicine affects you. Do not stand or sit up quickly, especially if you are an older patient. This reduces the risk of dizzy or fainting spells. Alcohol may interfere with the effect of this medicine. Avoid alcoholic drinks. This medicine may affect your blood sugar level. If you have diabetes, check with your doctor or health care professional before changing the dose of your diabetic medicine. This medicine can make you more sensitive to the sun. Keep out of the sun. If you cannot avoid being in the sun, wear protective clothing and use sunscreen. Do not use sun lamps or tanning beds/booths. What side effects may I notice from receiving this medicine? Side effects that you should report to your doctor or health care professional as soon as possible: -allergic reactions such as skin rash or itching, hives, swelling of the lips, mouth, tongue, or throat -changes in vision -chest pain -eye pain -fast or irregular heartbeat -feeling faint or lightheaded, falls -gout attack -muscle pain or cramps -pain or difficulty when passing urine -pain, tingling, numbness in the hands or feet -redness, blistering, peeling or loosening of the skin, including inside the mouth -unusually weak or tired Side effects that usually do not require medical attention (report to your doctor or health care professional if they continue or are bothersome): -change in sex drive or performance -dry mouth -headache -stomach upset This list may not describe all possible side effects. Call your doctor for medical advice about side effects. You may report side effects to FDA at 1-800-FDA-1088. Where should I keep my medicine? Keep out of the reach of children. Store at room temperature between 15 and 30 degrees C (59 and 86 degrees F). Do not freeze. Protect from light and moisture. Keep container closed tightly. Throw away any unused medicine after the  expiration date. NOTE: This sheet is a summary. It may not cover all possible information. If you have questions about this medicine, talk to your doctor, pharmacist, or health care provider.    2016, Elsevier/[employer] Standard. (2010-07-02 12:57:37)

## 2016-06-29 ENCOUNTER — Other Ambulatory Visit: Payer: Self-pay

## 2016-06-29 MED ORDER — CARVEDILOL 25 MG PO TABS: 25.0000 mg | ORAL_TABLET | Freq: Two times a day (BID) | ORAL | 3 refills | Status: AC

## 2016-07-06 DIAGNOSIS — I4891 Unspecified atrial fibrillation: Secondary | ICD-10-CM | POA: Diagnosis not present

## 2016-07-06 DIAGNOSIS — E119 Type 2 diabetes mellitus without complications: Secondary | ICD-10-CM | POA: Diagnosis not present

## 2016-07-06 DIAGNOSIS — Z1389 Encounter for screening for other disorder: Secondary | ICD-10-CM | POA: Diagnosis not present

## 2016-07-06 DIAGNOSIS — I10 Essential (primary) hypertension: Secondary | ICD-10-CM | POA: Diagnosis not present

## 2016-07-26 DIAGNOSIS — H35363 Drusen (degenerative) of macula, bilateral: Secondary | ICD-10-CM | POA: Diagnosis not present

## 2016-08-20 DIAGNOSIS — Z23 Encounter for immunization: Secondary | ICD-10-CM | POA: Diagnosis not present

## 2016-08-26 ENCOUNTER — Other Ambulatory Visit: Payer: Self-pay | Admitting: Internal Medicine

## 2016-08-26 ENCOUNTER — Other Ambulatory Visit: Payer: Self-pay | Admitting: Physician Assistant

## 2016-08-31 DIAGNOSIS — C44622 Squamous cell carcinoma of skin of right upper limb, including shoulder: Secondary | ICD-10-CM | POA: Diagnosis not present

## 2016-08-31 DIAGNOSIS — L989 Disorder of the skin and subcutaneous tissue, unspecified: Secondary | ICD-10-CM | POA: Diagnosis not present

## 2016-08-31 DIAGNOSIS — Z6827 Body mass index (BMI) 27.0-27.9, adult: Secondary | ICD-10-CM | POA: Diagnosis not present

## 2017-01-06 DIAGNOSIS — D2261 Melanocytic nevi of right upper limb, including shoulder: Secondary | ICD-10-CM | POA: Diagnosis not present

## 2017-01-06 DIAGNOSIS — D2262 Melanocytic nevi of left upper limb, including shoulder: Secondary | ICD-10-CM | POA: Diagnosis not present

## 2017-01-06 DIAGNOSIS — C44619 Basal cell carcinoma of skin of left upper limb, including shoulder: Secondary | ICD-10-CM | POA: Diagnosis not present

## 2017-01-06 DIAGNOSIS — D225 Melanocytic nevi of trunk: Secondary | ICD-10-CM | POA: Diagnosis not present

## 2017-01-06 DIAGNOSIS — Z85828 Personal history of other malignant neoplasm of skin: Secondary | ICD-10-CM | POA: Diagnosis not present

## 2017-03-06 DIAGNOSIS — I10 Essential (primary) hypertension: Secondary | ICD-10-CM | POA: Diagnosis not present

## 2017-03-06 DIAGNOSIS — E119 Type 2 diabetes mellitus without complications: Secondary | ICD-10-CM | POA: Diagnosis not present

## 2017-03-06 DIAGNOSIS — Z125 Encounter for screening for malignant neoplasm of prostate: Secondary | ICD-10-CM | POA: Diagnosis not present

## 2017-03-06 DIAGNOSIS — E559 Vitamin D deficiency, unspecified: Secondary | ICD-10-CM | POA: Diagnosis not present

## 2017-03-06 DIAGNOSIS — E784 Other hyperlipidemia: Secondary | ICD-10-CM | POA: Diagnosis not present

## 2017-03-13 DIAGNOSIS — Z0000 Encounter for general adult medical examination without abnormal findings: Secondary | ICD-10-CM | POA: Diagnosis not present

## 2017-03-13 DIAGNOSIS — E119 Type 2 diabetes mellitus without complications: Secondary | ICD-10-CM | POA: Diagnosis not present

## 2017-03-13 DIAGNOSIS — Z1389 Encounter for screening for other disorder: Secondary | ICD-10-CM | POA: Diagnosis not present

## 2017-03-13 DIAGNOSIS — E784 Other hyperlipidemia: Secondary | ICD-10-CM | POA: Diagnosis not present

## 2017-03-13 DIAGNOSIS — L988 Other specified disorders of the skin and subcutaneous tissue: Secondary | ICD-10-CM | POA: Diagnosis not present

## 2017-03-13 DIAGNOSIS — E559 Vitamin D deficiency, unspecified: Secondary | ICD-10-CM | POA: Diagnosis not present

## 2017-03-13 DIAGNOSIS — Z6828 Body mass index (BMI) 28.0-28.9, adult: Secondary | ICD-10-CM | POA: Diagnosis not present

## 2017-03-14 DIAGNOSIS — Z1212 Encounter for screening for malignant neoplasm of rectum: Secondary | ICD-10-CM | POA: Diagnosis not present

## 2017-04-12 DIAGNOSIS — H6123 Impacted cerumen, bilateral: Secondary | ICD-10-CM | POA: Diagnosis not present

## 2017-04-12 DIAGNOSIS — Z6826 Body mass index (BMI) 26.0-26.9, adult: Secondary | ICD-10-CM | POA: Diagnosis not present

## 2017-04-12 DIAGNOSIS — H9193 Unspecified hearing loss, bilateral: Secondary | ICD-10-CM | POA: Diagnosis not present

## 2017-05-31 ENCOUNTER — Ambulatory Visit: Payer: BLUE CROSS/BLUE SHIELD | Admitting: Cardiology

## 2017-06-08 ENCOUNTER — Ambulatory Visit: Payer: BLUE CROSS/BLUE SHIELD | Admitting: Cardiology

## 2017-06-29 ENCOUNTER — Ambulatory Visit: Payer: BLUE CROSS/BLUE SHIELD | Admitting: Cardiology

## 2017-07-12 ENCOUNTER — Encounter: Payer: Self-pay | Admitting: Cardiology

## 2017-07-25 ENCOUNTER — Other Ambulatory Visit: Payer: Self-pay | Admitting: Internal Medicine

## 2017-07-26 NOTE — Telephone Encounter (Signed)
Pt last saw Dr Elberta Fortisamnitz on 05/23/16, has upcoming appt on 08/02/17, last labs on 05/09/16 Creat 1.02, overdue for labs note placed on f/u appt with Camnitz on 08/02/17 needs repeat CBC and BMP. Age 66, weight 85.5kg, based on specified criteria pt is on appropriate dosage of Eliquis 5mg  BID.

## 2017-08-02 ENCOUNTER — Ambulatory Visit: Payer: BLUE CROSS/BLUE SHIELD | Admitting: Cardiology

## 2017-08-07 NOTE — Telephone Encounter (Signed)
Pt has cancelled multiple follow-up appts with Dr Elberta Fortis, 2 appts in July, 1 in August, and most recently 08/02/17 appt.  Refused med to pharmacy, pt needs OV and has been made aware of this on multiple occassions.

## 2017-09-26 ENCOUNTER — Ambulatory Visit (INDEPENDENT_AMBULATORY_CARE_PROVIDER_SITE_OTHER): Payer: BLUE CROSS/BLUE SHIELD | Admitting: Cardiology

## 2017-09-26 ENCOUNTER — Encounter: Payer: Self-pay | Admitting: Cardiology

## 2017-09-26 VITALS — BP 158/90 | HR 69 | Ht 70.0 in | Wt 195.0 lb

## 2017-09-26 DIAGNOSIS — I10 Essential (primary) hypertension: Secondary | ICD-10-CM | POA: Diagnosis not present

## 2017-09-26 DIAGNOSIS — I480 Paroxysmal atrial fibrillation: Secondary | ICD-10-CM

## 2017-09-26 NOTE — Patient Instructions (Signed)

## 2017-09-26 NOTE — Progress Notes (Signed)
Electrophysiology Office Note   Date:  09/26/2017   ID:  Jacob Meadows, DOB 10/03/1951, MRN [id number]  PCP:  Jacob RanPerini, Mark, MD  Primary Electrophysiologist:  Jacob Meadows   Chief Complaint  Patient presents with  . Follow-up    PAF     History of Present Illness: Jacob Meadows is a 66 y.o. male who presents today for electrophysiology evaluation.  Jacob Meadows is a 66 y.o. male with history of Paroxysmal AFib, HTN, mild OSA.  He had an atrial fibrillation ablation on 02/23/16.  He tolerated the procedure well.  He has not noted any further atrial fibrillation since the procedure was done.  Today, denies symptoms of palpitations, chest pain, shortness of breath, orthopnea, PND, lower extremity edema, claudication, dizziness, presyncope, syncope, bleeding, or neurologic sequela. The patient is tolerating medications without difficulties.    Past Medical History:  Diagnosis Date  . Atrial flutter (HCC)    "just the last week or so" (01/29/2016)  . Essential hypertension 11/24/2015  . High cholesterol   . Nonspecific abnormal electrocardiogram (ECG) low voltage limb leads 11/24/2015  . Persistent atrial fibrillation (HCC) 11/24/2015  . Sleep-disordered breathing 11/24/2015   Past Surgical History:  Procedure Laterality Date  . ATRIAL FIBRILLATION ABLATION  02/23/2016  . ATRIAL FLUTTER ABLATION  02/23/2016  . CARDIOVERSION  12/2015   "done in ER"  . TONSILLECTOMY  1957     Current Outpatient Medications  Medication Sig Dispense Refill  . atorvastatin (LIPITOR) 10 MG tablet Take 10 mg by mouth daily.    . carvedilol (COREG) 25 MG tablet Take 1 tablet (25 mg total) by mouth 2 (two) times daily. 180 tablet 3  . ELIQUIS 5 MG TABS tablet TAKE 1 TABLET TWICE DAILY. 60 tablet 9  . ibuprofen (ADVIL,MOTRIN) 200 MG tablet Take 200 mg by mouth every 6 (six) hours as needed for moderate pain.    Marland Kitchen. losartan (COZAAR) 100 MG tablet TAKE 1 TABLET ONCE DAILY. 30 tablet 9  . Multiple Vitamins-Minerals  (CENTRUM SILVER ADULT 50+ PO) Take 1 tablet by mouth daily.    . valsartan (DIOVAN) 320 MG tablet Take 320 mg by mouth daily.     No current facility-administered medications for this visit.     Allergies:   Patient has no known allergies.   Social History:  The patient  reports that he has been smoking cigars.  He has smoked for the past 15.00 years. he has never used smokeless tobacco. He reports that he drinks about 4.2 - 5.4 oz of alcohol per week. He reports that he does not use drugs.   Family History:  The patient's family history includes Alzheimer's disease in his mother; Atrial fibrillation in his father; Congestive Heart Failure in his father.    ROS:  Please see the history of present illness.   Otherwise, review of systems is positive for none.   All other systems are reviewed and negative.   PHYSICAL EXAM: VS:  BP (!) 158/90   Pulse 69   Ht 5\' 10"  (1.778 m)   Wt 195 lb (88.5 kg)   BMI 27.98 kg/m  , BMI Body mass index is 27.98 kg/m. GEN: Well nourished, well developed, in no acute distress  HEENT: normal  Neck: no JVD, carotid bruits, or masses Cardiac: RRR; no murmurs, rubs, or gallops,no edema  Respiratory:  clear to auscultation bilaterally, normal work of breathing GI: soft, nontender, nondistended, + BS MS: no deformity or atrophy  Skin: warm and  dry Neuro:  Strength and sensation are intact Psych: euthymic mood, full affect  EKG:  EKG is ordered today. Personal review of the ekg ordered shows sinus rhythm, rate 69, PVC   Recent Labs: No results found for requested labs within last 8760 hours.    Lipid Panel  No results found for: CHOL, TRIG, HDL, CHOLHDL, VLDL, LDLCALC, LDLDIRECT   Wt Readings from Last 3 Encounters:  09/26/17 195 lb (88.5 kg)  05/23/16 188 lb 9.6 oz (85.5 kg)  05/09/16 184 lb (83.5 kg)      Other studies Reviewed: Additional studies/ records that were reviewed today include: TTE 11/27/15, SPECT 01/28/16   Review of the above  records today demonstrates:   - Left ventricle: The cavity size was normal. Wall thickness was  increased in a pattern of mild LVH. Systolic function was normal.  The estimated ejection fraction was in the range of 55% to 60%.  Wall motion was normal; there were no regional wall motion  abnormalities. - Left atrium: The atrium was mildly dilated.   Nuclear stress EF: 46%.  There was no ST segment deviation noted during stress.  No T wave inversion was noted during stress.  The study is normal.  This is a low risk study.  The left ventricular ejection fraction is mildly decreased (45-54%).  Mild septal hypokinesis.  ASSESSMENT AND PLAN:  1.  Persistent atrial fibrillation/atrial flutter: On Eliquis.  Had ablation 02/23/16.  Is done well since that time.  Has not noted any further atrial fibrillation.  No changes.   This patients CHA2DS2-VASc Score and unadjusted Ischemic Stroke Rate (% per year) is equal to 2.2 % stroke rate/year from a score of 2  Above score calculated as 1 point each if present [CHF, HTN, DM, Vascular=MI/PAD/Aortic Plaque, Age if 65-74, or Male] Above score calculated as 2 points each if present [Age > 75, or Stroke/TIA/TE]  2. Hypertension: Pressure elevated today.  He has had blood pressures that were normal to low at his primary physicians who has recently stopped his HCTZ.  No changes at this time.  Current medicines are reviewed at length with the patient today.   The patient does not have concerns regarding his medicines.  The following changes were made today:  none  Labs/ tests ordered today include:  Orders Placed This Encounter  Procedures  . EKG 12-Lead     Disposition:   FU with Jacob Meadows 6 months  Signed, Jacob Jorja LoaMartin Camnitz, MD  09/26/2017 9:04 AM     [facility] [address] [place] [address] [phone] (office) [phone] (fax)

## 2017-10-25 DIAGNOSIS — Z6828 Body mass index (BMI) 28.0-28.9, adult: Secondary | ICD-10-CM | POA: Diagnosis not present

## 2017-10-25 DIAGNOSIS — R05 Cough: Secondary | ICD-10-CM | POA: Diagnosis not present

## 2017-10-25 DIAGNOSIS — J40 Bronchitis, not specified as acute or chronic: Secondary | ICD-10-CM | POA: Diagnosis not present

## 2018-01-08 IMAGING — CR DG CHEST 2V
2 series · 2 of 2 positions shown · non-contrast
Comparison: 11/24/2015

CLINICAL DATA: Mid to left-sided chest pain.  High blood pressure.

EXAM:
CHEST  2 VIEW

[chest pa]
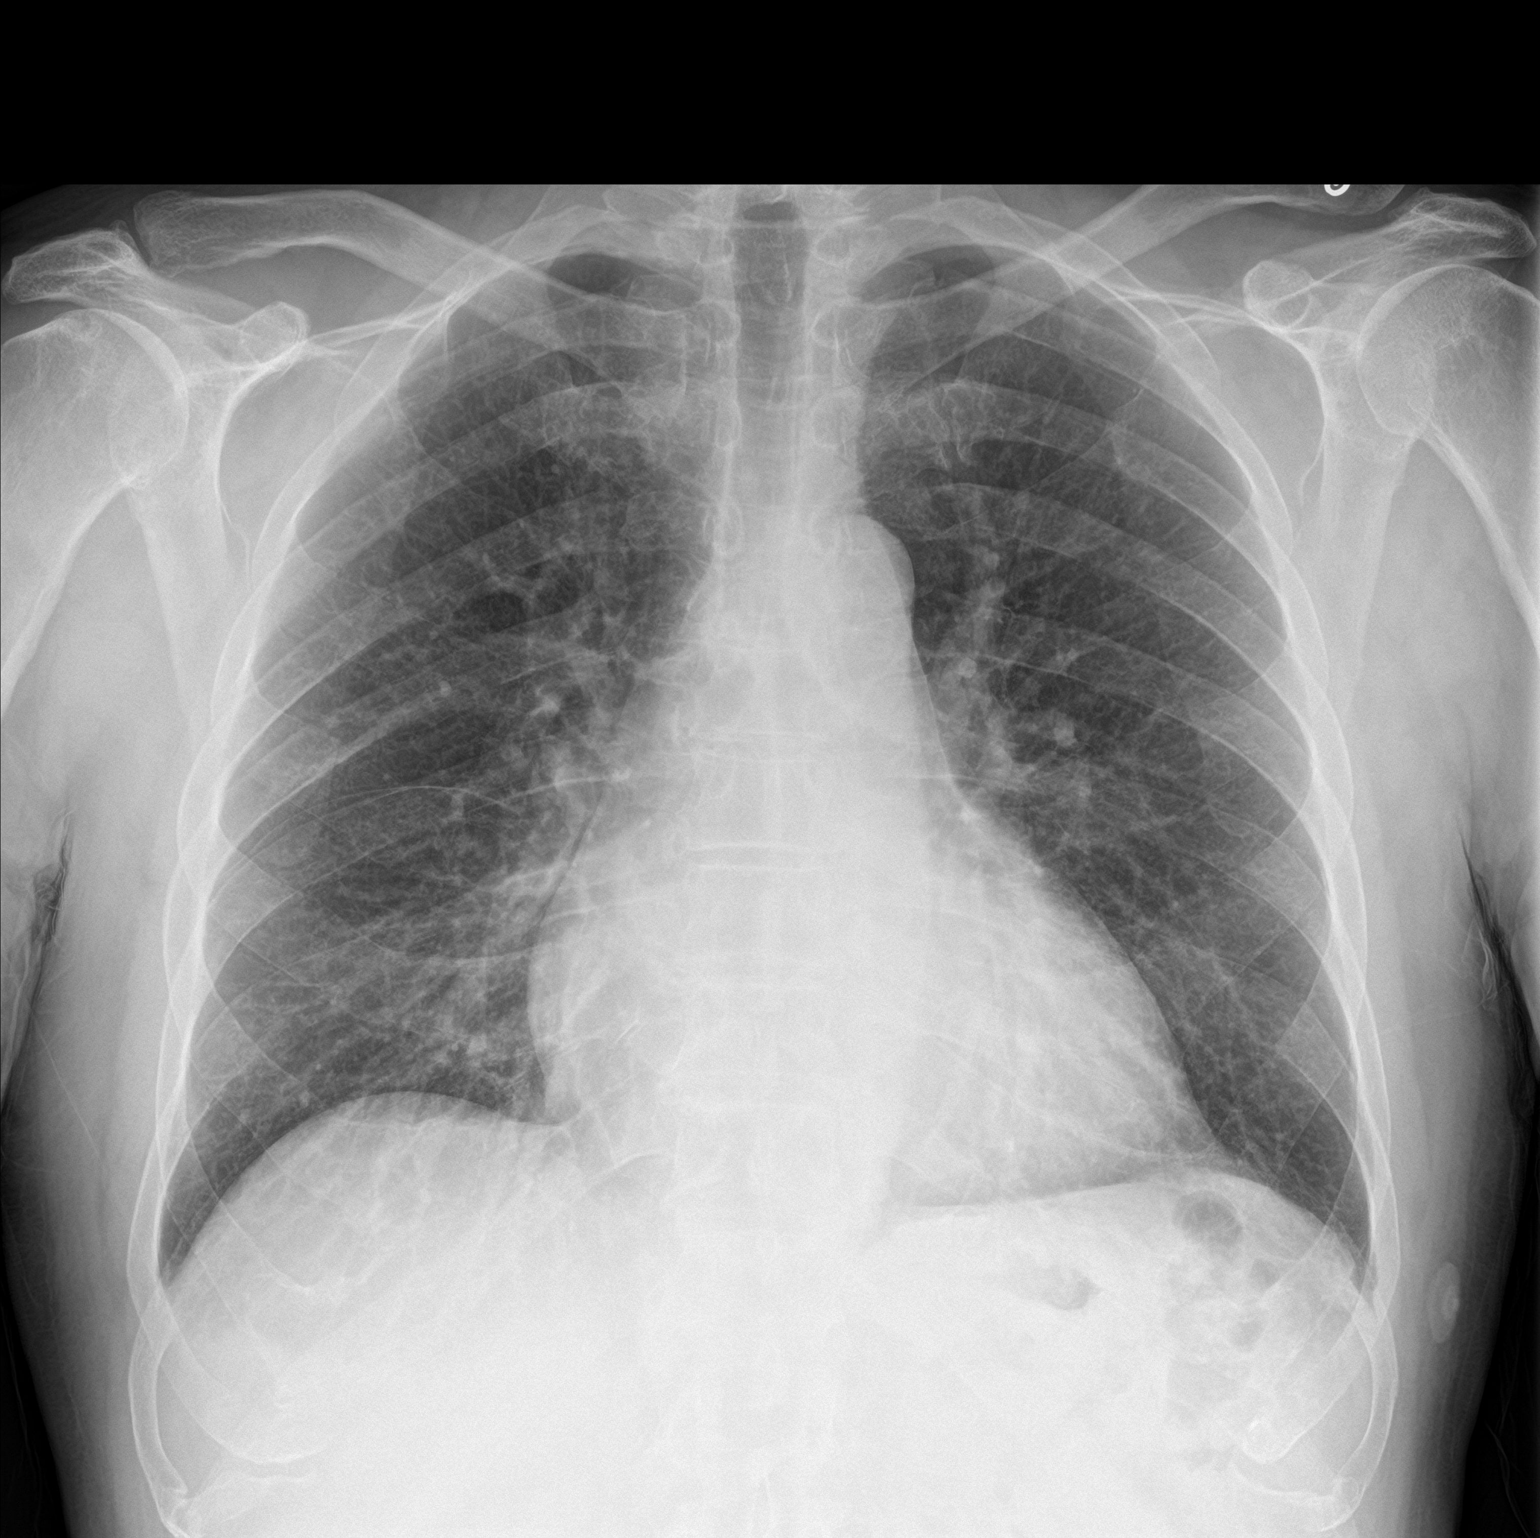

[chest lat]
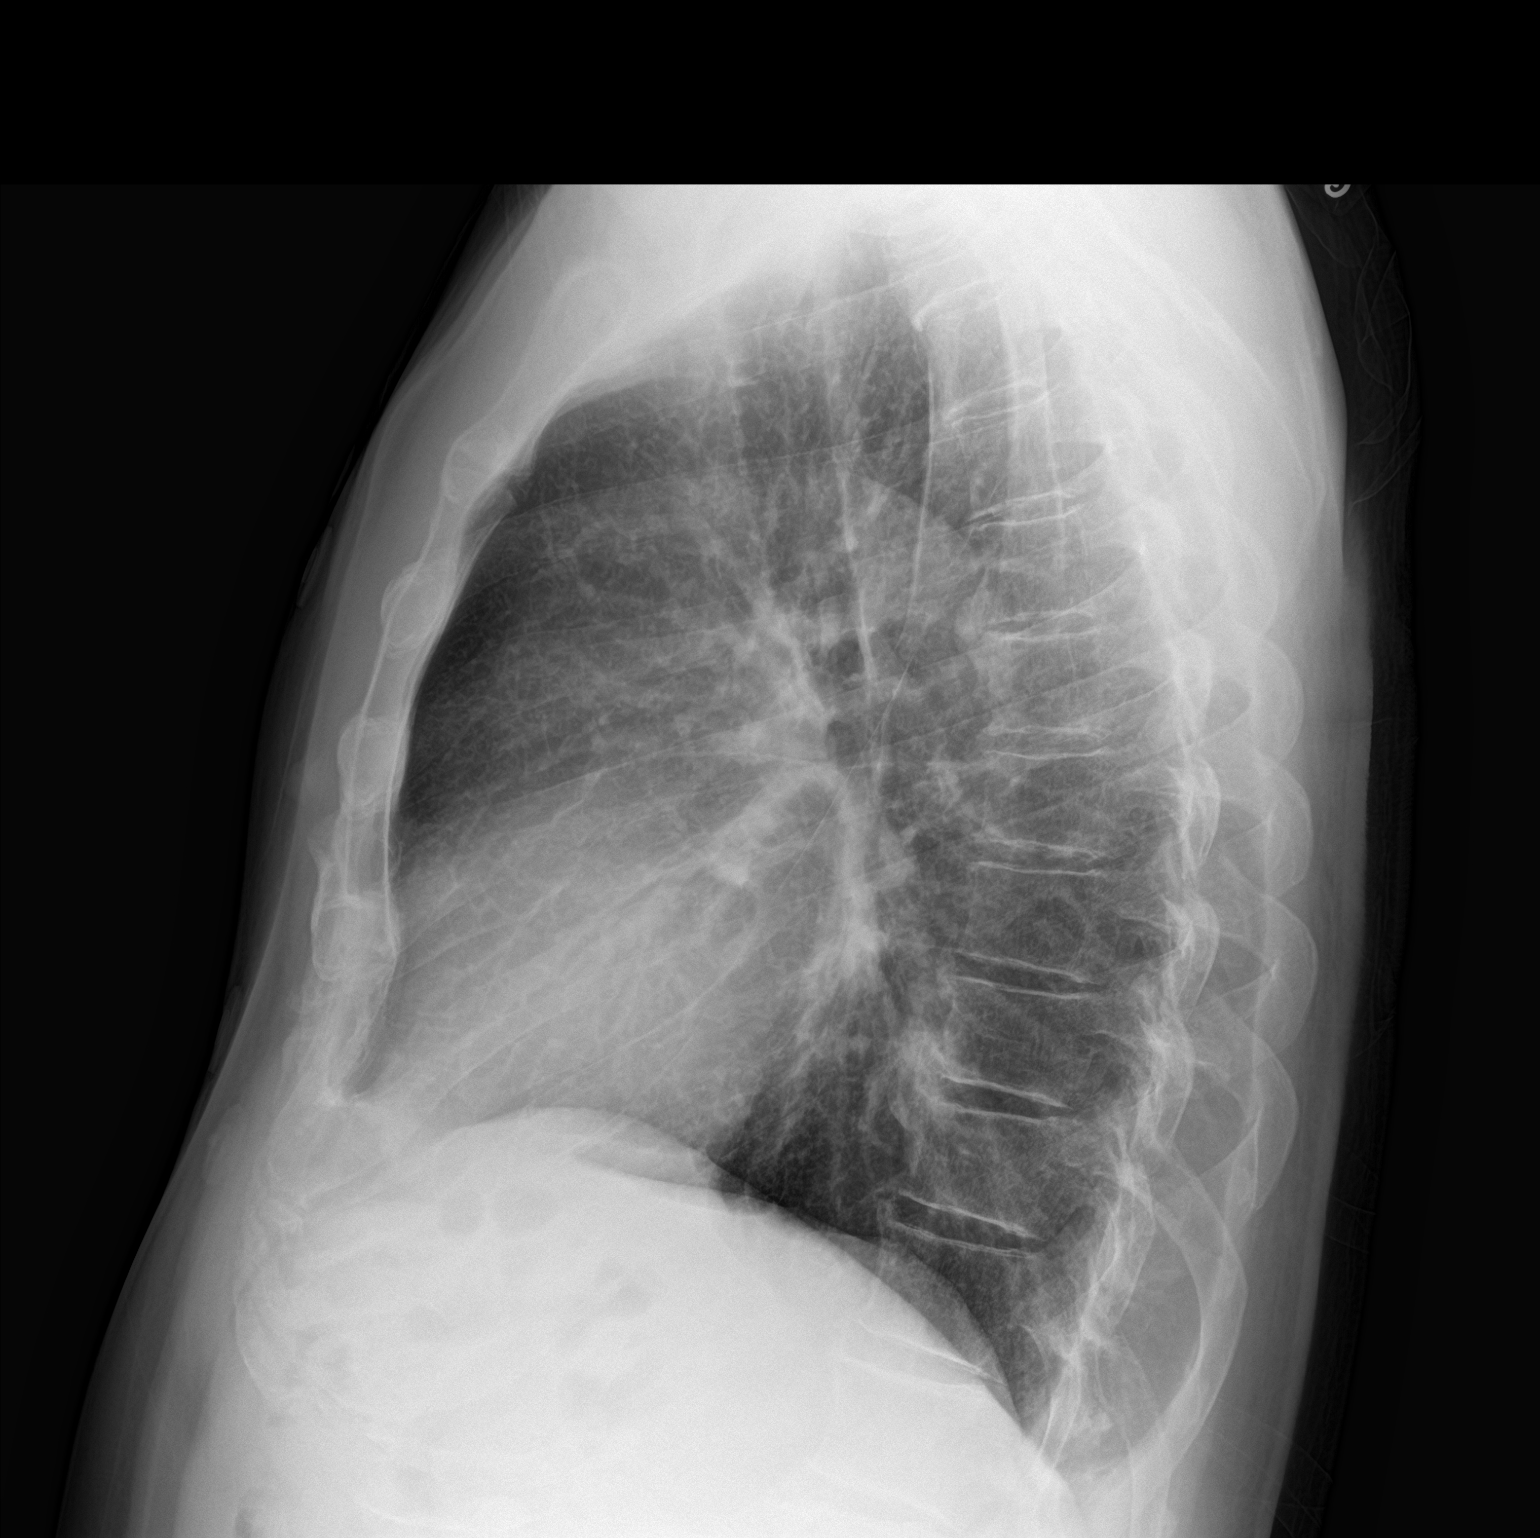

[2 of 2 positions shown; findings below may reference images not displayed]

FINDINGS: Borderline heart size with normal pulmonary vascularity. No focal
airspace disease or consolidation in the lungs. No blunting of
costophrenic angles. No pneumothorax. Mediastinal contours appear
intact. Degenerative changes in the spine and shoulders.
IMPRESSION: No active cardiopulmonary disease.

## 2018-02-21 IMAGING — CT CT HEART MORP W/ CTA COR W/ SCORE W/ CA W/CM &/OR W/O CM
1 of 6 series · 1 of 20 positions shown, 2 images · IV contrast (Iodine)
Comparison: None.

CLINICAL DATA: Pre Atrial Fibrillation Ablation

EXAM:
Cardiac CTA
MEDICATIONS:
None
TECHNIQUE: The patient was scanned on a [employer] [REDACTED]ice scanner. Gantry
rotation speed was 270 msecs. Collimation was .9mm. A 100 kV
prospective scan was triggered in the descending thoracic aorta at
111 HU's with 5% padding centered around 78% of the R-R interval.
Average HR during the scan was bpm. The 3D data set was interpreted
on a dedicated work station using MPR, MIP and VRT modes. A total of
80cc of contrast was used.

[Series 300: locator · axial · 0.35mm/px · z∈[-201,-201]mm · 1 of 1 slices shown, 2 images]
[im 1/1  vessel]
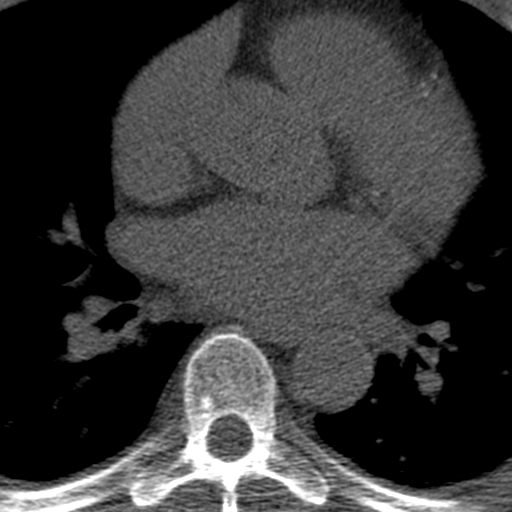
[im 1/1  lung]
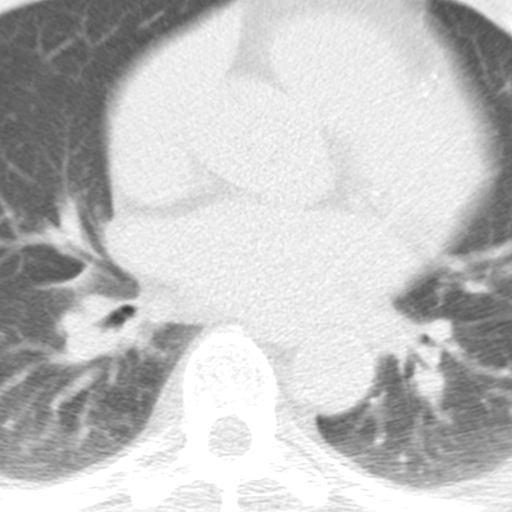

[1 of 20 positions shown; findings below may reference images not displayed]

FINDINGS: There was mild LAE. There was no ASD/PFO. There was no pericardial
effusion. The RASHON had no thrombus. It was somewhat unusual in having
primarily a large superior lobe

All 4 pulmonary veins drained normally into the LA. Measurements
were as follows

LUPV:  Ostium 18.7 mm Area 2.3 cm2

LLPV:  Ostium 17.6 mm Area 2.2 cm2

RLPV:  Ostium 20.5 mm Area 2.3 cm2

RSP: Ostium 20.8 mm Area 3.0 cm2
IMPRESSION: 1) All 4 pulmonary veins draining normally into LA no anomalies
Measurements above

2) No ASD/PFO

3) Mild LAE

4) No RASHON thrombus

5) No pericardia Effusion

Angi Billiot

EXAM:
OVER-READ INTERPRETATION  CT CHEST

The following report is an over-read performed by radiologist Dr.
Brunek Sindjui [REDACTED] on 02/17/2016. This
over-read does not include interpretation of cardiac or coronary
anatomy or pathology. The coronary CTA interpretation by the
cardiologist is attached.
FINDINGS: No suspicious pulmonary nodules.

Small mediastinal lymph nodes measuring up to 8 mm short axis.

Visualized upper abdomen is unremarkable.

Mild degenerative changes of the thoracic spine.
IMPRESSION: No significant extracardiac findings.

## 2018-04-11 DIAGNOSIS — Z125 Encounter for screening for malignant neoplasm of prostate: Secondary | ICD-10-CM | POA: Diagnosis not present

## 2018-04-11 DIAGNOSIS — E119 Type 2 diabetes mellitus without complications: Secondary | ICD-10-CM | POA: Diagnosis not present

## 2018-04-11 DIAGNOSIS — E7849 Other hyperlipidemia: Secondary | ICD-10-CM | POA: Diagnosis not present

## 2018-04-11 DIAGNOSIS — E559 Vitamin D deficiency, unspecified: Secondary | ICD-10-CM | POA: Diagnosis not present

## 2018-04-11 DIAGNOSIS — Z0000 Encounter for general adult medical examination without abnormal findings: Secondary | ICD-10-CM | POA: Diagnosis not present

## 2018-04-18 DIAGNOSIS — E559 Vitamin D deficiency, unspecified: Secondary | ICD-10-CM | POA: Diagnosis not present

## 2018-04-18 DIAGNOSIS — H9193 Unspecified hearing loss, bilateral: Secondary | ICD-10-CM | POA: Diagnosis not present

## 2018-04-18 DIAGNOSIS — Z0000 Encounter for general adult medical examination without abnormal findings: Secondary | ICD-10-CM | POA: Diagnosis not present

## 2018-04-18 DIAGNOSIS — E7849 Other hyperlipidemia: Secondary | ICD-10-CM | POA: Diagnosis not present

## 2018-04-18 DIAGNOSIS — Z1389 Encounter for screening for other disorder: Secondary | ICD-10-CM | POA: Diagnosis not present

## 2018-04-18 DIAGNOSIS — I10 Essential (primary) hypertension: Secondary | ICD-10-CM | POA: Diagnosis not present

## 2018-04-24 DIAGNOSIS — Z1212 Encounter for screening for malignant neoplasm of rectum: Secondary | ICD-10-CM | POA: Diagnosis not present

## 2018-12-06 ENCOUNTER — Encounter: Payer: Self-pay | Admitting: *Deleted

## 2018-12-17 ENCOUNTER — Encounter: Payer: Self-pay | Admitting: Cardiology

## 2018-12-17 ENCOUNTER — Ambulatory Visit: Payer: BLUE CROSS/BLUE SHIELD | Admitting: Cardiology

## 2018-12-17 VITALS — BP 156/80 | HR 62 | Ht 69.0 in | Wt 190.4 lb

## 2018-12-17 DIAGNOSIS — I4819 Other persistent atrial fibrillation: Secondary | ICD-10-CM | POA: Diagnosis not present

## 2018-12-17 NOTE — Progress Notes (Signed)
Electrophysiology Office Note   Date:  12/17/2018   ID:  Jacob Meadows, DOB 07/09/1951, MRN [id number]  PCP:  Jacob RanPerini, Mark, MD  Primary Electrophysiologist:  Jacob Meadows   Chief Complaint  Patient presents with  . Other    Past due 6 month follow up. Patient denies chest pain and SOB at this time. Patient states he has been doing good. Patient was last seen 09/2017.      History of Present Illness: Jacob Meadows is a 68 y.o. male who presents today for electrophysiology evaluation.  Jacob Meadows is a 68 y.o. male with history of Paroxysmal AFib, HTN, mild OSA.  He had an atrial fibrillation ablation on 02/23/16.  He tolerated the procedure well.    Today, denies symptoms of palpitations, chest pain, shortness of breath, orthopnea, PND, lower extremity edema, claudication, dizziness, presyncope, syncope, bleeding, or neurologic sequela. The patient is tolerating medications without difficulties.  Overall he is doing well.  He is noted no further episodes of atrial fibrillation.  Unfortunately, over Labor Day his dog died.  He had to be put down as he had gotten old.  He is planning on adopting a new dog this spring.  Past Medical History:  Diagnosis Date  . Atrial flutter (HCC)    "just the last week or so" (01/29/2016)  . Essential hypertension 11/24/2015  . High cholesterol   . Nonspecific abnormal electrocardiogram (ECG) low voltage limb leads 11/24/2015  . Persistent atrial fibrillation (HCC) 11/24/2015  . Sleep-disordered breathing 11/24/2015   Past Surgical History:  Procedure Laterality Date  . ATRIAL FIBRILLATION ABLATION  02/23/2016  . ATRIAL FLUTTER ABLATION  02/23/2016  . CARDIOVERSION N/A 12/16/2015   Procedure: CARDIOVERSION;  Surgeon: Chrystie NoseKenneth C Hilty, MD;  [facility];  Service: Cardiovascular;  Laterality: N/A;  . CARDIOVERSION  12/2015   "done in ER"  . ELECTROPHYSIOLOGIC STUDY N/A 02/23/2016   Procedure: Atrial Fibrillation Ablation;  Surgeon: Juanjesus Pepperman Jorja LoaMartin Jden Want,  MD;  Location: [facility] INVASIVE CV LAB;  Service: Cardiovascular;  Laterality: N/A;  . TONSILLECTOMY  1957     Current Outpatient Medications  Medication Sig Dispense Refill  . atorvastatin (LIPITOR) 10 MG tablet Take 10 mg by mouth daily.    . carvedilol (COREG) 25 MG tablet Take 1 tablet (25 mg total) by mouth 2 (two) times daily. 180 tablet 3  . ELIQUIS 5 MG TABS tablet TAKE 1 TABLET TWICE DAILY. 60 tablet 9  . ibuprofen (ADVIL,MOTRIN) 200 MG tablet Take 200 mg by mouth every 6 (six) hours as needed for moderate pain.    Marland Kitchen. losartan (COZAAR) 100 MG tablet TAKE 1 TABLET ONCE DAILY. 30 tablet 9  . Multiple Vitamins-Minerals (CENTRUM SILVER ADULT 50+ PO) Take 1 tablet by mouth daily.    . valsartan (DIOVAN) 320 MG tablet Take 320 mg by mouth daily.     No current facility-administered medications for this visit.     Allergies:   Patient has no known allergies.   Social History:  The patient  reports that he has been smoking cigars. He has smoked for the past 15.00 years. He has never used smokeless tobacco. He reports current alcohol use of about 7.0 - 9.0 standard drinks of alcohol per week. He reports that he does not use drugs.   Family History:  The patient's family history includes Alzheimer's disease in his mother; Atrial fibrillation in his father; Congestive Heart Failure in his father.    ROS:  Please see the history of  present illness.   Otherwise, review of systems is positive for none.   All other systems are reviewed and negative.   PHYSICAL EXAM: VS:  BP (!) 156/80 (BP Location: Left Arm, Patient Position: Sitting, Cuff Size: Normal)   Pulse 62   Ht 5\' 9"  (1.753 m)   Wt 190 lb 6 oz (86.4 kg)   BMI 28.11 kg/m  , BMI Body mass index is 28.11 kg/m. GEN: Well nourished, well developed, in no acute distress  HEENT: normal  Neck: no JVD, carotid bruits, or masses Cardiac: RRR; no murmurs, rubs, or gallops,no edema  Respiratory:  clear to auscultation bilaterally, normal work  of breathing GI: soft, nontender, nondistended, + BS MS: no deformity or atrophy  Skin: warm and dry Neuro:  Strength and sensation are intact Psych: euthymic mood, full affect  EKG:  EKG is ordered today. Personal review of the ekg ordered shows SR, rate 62   Recent Labs: No results found for requested labs within last 8760 hours.    Lipid Panel  No results found for: CHOL, TRIG, HDL, CHOLHDL, VLDL, LDLCALC, LDLDIRECT   Wt Readings from Last 3 Encounters:  12/17/18 190 lb 6 oz (86.4 kg)  09/26/17 195 lb (88.5 kg)  05/23/16 188 lb 9.6 oz (85.5 kg)      Other studies Reviewed: Additional studies/ records that were reviewed today include: TTE 11/27/15, SPECT 01/28/16   Review of the above records today demonstrates:   - Left ventricle: The cavity size was normal. Wall thickness was  increased in a pattern of mild LVH. Systolic function was normal.  The estimated ejection fraction was in the range of 55% to 60%.  Wall motion was normal; there were no regional wall motion  abnormalities. - Left atrium: The atrium was mildly dilated.   Nuclear stress EF: 46%.  There was no ST segment deviation noted during stress.  No T wave inversion was noted during stress.  The study is normal.  This is a low risk study.  The left ventricular ejection fraction is mildly decreased (45-54%).  Mild septal hypokinesis.  ASSESSMENT AND PLAN:  1.  Persistent atrial fibrillation/atrial flutter: Currently on Eliquis status post ablation 02/23/2016.  Is unwell since that time.  No further episodes of atrial fibrillation.  No changes.   This patients CHA2DS2-VASc Score and unadjusted Ischemic Stroke Rate (% per year) is equal to 2.2 % stroke rate/year from a score of 2  Above score calculated as 1 point each if present [CHF, HTN, DM, Vascular=MI/PAD/Aortic Plaque, Age if 65-74, or Male] Above score calculated as 2 points each if present [Age > 75, or Stroke/TIA/TE]   2. Hypertension:  Blood pressure is elevated today.  He checks it at home and it is certainly more normal in the 1 teens.  No changes.  Plan per primary physician.  Current medicines are reviewed at length with the patient today.   The patient does not have concerns regarding his medicines.  The following changes were made today: None  Labs/ tests ordered today include:  Orders Placed This Encounter  Procedures  . EKG 12-Lead     Disposition:   FU with Deneka Greenwalt 12 months  Signed, Soledad Budreau Jorja Loa, MD  12/17/2018 10:03 AM     [facility] [address] [place] [address] [phone] (office) [phone] (fax)

## 2018-12-17 NOTE — Patient Instructions (Signed)
Medication Instructions:  Your physician recommends that you continue on your current medications as directed. Please refer to the Current Medication list given to you today.  * If you need a refill on your cardiac medications before your next appointment, please call your pharmacy.   Labwork: None ordered  Testing/Procedures: None ordered  Follow-Up: Your physician wants you to follow-up in: 1 year with Dr. Camnitz.  You will receive a reminder letter in the mail two months in advance. If you don't receive a letter, please call our office to schedule the follow-up appointment.  Thank you for choosing [facility]!!   Lunden Stieber, RN [phone]        

## 2019-01-22 DIAGNOSIS — H353131 Nonexudative age-related macular degeneration, bilateral, early dry stage: Secondary | ICD-10-CM | POA: Diagnosis not present

## 2019-01-30 DIAGNOSIS — E119 Type 2 diabetes mellitus without complications: Secondary | ICD-10-CM | POA: Diagnosis not present

## 2019-01-30 DIAGNOSIS — R05 Cough: Secondary | ICD-10-CM | POA: Diagnosis not present

## 2019-01-30 DIAGNOSIS — R509 Fever, unspecified: Secondary | ICD-10-CM | POA: Diagnosis not present

## 2019-05-08 DIAGNOSIS — Z0000 Encounter for general adult medical examination without abnormal findings: Secondary | ICD-10-CM | POA: Diagnosis not present

## 2019-05-08 DIAGNOSIS — E119 Type 2 diabetes mellitus without complications: Secondary | ICD-10-CM | POA: Diagnosis not present

## 2019-05-08 DIAGNOSIS — E559 Vitamin D deficiency, unspecified: Secondary | ICD-10-CM | POA: Diagnosis not present

## 2019-05-08 DIAGNOSIS — I10 Essential (primary) hypertension: Secondary | ICD-10-CM | POA: Diagnosis not present

## 2019-05-08 DIAGNOSIS — Z125 Encounter for screening for malignant neoplasm of prostate: Secondary | ICD-10-CM | POA: Diagnosis not present

## 2019-05-13 DIAGNOSIS — R82998 Other abnormal findings in urine: Secondary | ICD-10-CM | POA: Diagnosis not present

## 2019-05-13 DIAGNOSIS — I10 Essential (primary) hypertension: Secondary | ICD-10-CM | POA: Diagnosis not present

## 2019-05-15 DIAGNOSIS — I10 Essential (primary) hypertension: Secondary | ICD-10-CM | POA: Diagnosis not present

## 2019-05-15 DIAGNOSIS — Z0000 Encounter for general adult medical examination without abnormal findings: Secondary | ICD-10-CM | POA: Diagnosis not present

## 2019-05-15 DIAGNOSIS — E119 Type 2 diabetes mellitus without complications: Secondary | ICD-10-CM | POA: Diagnosis not present

## 2019-05-15 DIAGNOSIS — H9193 Unspecified hearing loss, bilateral: Secondary | ICD-10-CM | POA: Diagnosis not present

## 2019-05-15 DIAGNOSIS — Z1331 Encounter for screening for depression: Secondary | ICD-10-CM | POA: Diagnosis not present

## 2019-05-15 DIAGNOSIS — I4891 Unspecified atrial fibrillation: Secondary | ICD-10-CM | POA: Diagnosis not present

## 2019-08-09 DIAGNOSIS — Z23 Encounter for immunization: Secondary | ICD-10-CM | POA: Diagnosis not present

## 2019-11-04 DIAGNOSIS — Z1212 Encounter for screening for malignant neoplasm of rectum: Secondary | ICD-10-CM | POA: Diagnosis not present

## 2019-11-04 DIAGNOSIS — Z1211 Encounter for screening for malignant neoplasm of colon: Secondary | ICD-10-CM | POA: Diagnosis not present

## 2019-11-19 ENCOUNTER — Ambulatory Visit: Payer: BC Managed Care – PPO | Attending: Internal Medicine

## 2019-11-19 DIAGNOSIS — Z20828 Contact with and (suspected) exposure to other viral communicable diseases: Secondary | ICD-10-CM | POA: Diagnosis not present

## 2019-11-19 DIAGNOSIS — Z20822 Contact with and (suspected) exposure to covid-19: Secondary | ICD-10-CM

## 2019-11-20 LAB — NOVEL CORONAVIRUS, NAA: SARS-CoV-2, NAA: NOT DETECTED

## 2019-12-29 ENCOUNTER — Ambulatory Visit: Payer: BC Managed Care – PPO | Attending: Internal Medicine

## 2019-12-29 DIAGNOSIS — Z23 Encounter for immunization: Secondary | ICD-10-CM

## 2019-12-29 NOTE — Progress Notes (Signed)
   Covid-19 Vaccination Clinic  Name:  Jacob Meadows    MRN: [id number] DOB: 07-31-1951  12/29/2019  [name] was observed post Covid-19 immunization for 15 minutes without incidence. He was provided with Vaccine Information Sheet and instruction to access the V-Safe system.   [name] was instructed to call 911 with any severe reactions post vaccine: Marland Kitchen Difficulty breathing  . Swelling of your face and throat  . A fast heartbeat  . A bad rash all over your body  . Dizziness and weakness    Immunizations Administered    Name Date Dose VIS Date Route   Pfizer COVID-19 Vaccine 12/29/2019  4:36 PM 0.3 mL 11/01/2019 Intramuscular   Manufacturer: [employer], [employer]   Lot: [id number]   NDC: [address]-7

## 2020-01-23 ENCOUNTER — Ambulatory Visit: Payer: BC Managed Care – PPO | Attending: Internal Medicine

## 2020-01-23 DIAGNOSIS — Z23 Encounter for immunization: Secondary | ICD-10-CM

## 2020-01-23 NOTE — Progress Notes (Signed)
   Covid-19 Vaccination Clinic  Name:  Jacob Meadows    MRN: [id number] DOB: [birth year]  01/23/2020  [name] was observed post Covid-19 immunization for 15 minutes without incident. He was provided with Vaccine Information Sheet and instruction to access the V-Safe system.   [name] was instructed to call 911 with any severe reactions post vaccine: Marland Kitchen Difficulty breathing  . Swelling of face and throat  . A fast heartbeat  . A bad rash all over body  . Dizziness and weakness   Immunizations Administered    Name Date Dose VIS Date Route   Pfizer COVID-19 Vaccine 01/23/2020 12:29 PM 0.3 mL 11/01/2019 Intramuscular   Manufacturer: [employer], [employer]   Lot: [id number]   NDC: [address]-5

## 2020-02-20 DIAGNOSIS — H353112 Nonexudative age-related macular degeneration, right eye, intermediate dry stage: Secondary | ICD-10-CM | POA: Diagnosis not present

## 2020-03-10 ENCOUNTER — Ambulatory Visit: Payer: BC Managed Care – PPO | Admitting: Cardiology

## 2020-03-10 ENCOUNTER — Other Ambulatory Visit: Payer: Self-pay

## 2020-03-10 ENCOUNTER — Encounter: Payer: Self-pay | Admitting: Cardiology

## 2020-03-10 VITALS — BP 144/80 | HR 66 | Ht 69.0 in | Wt 201.6 lb

## 2020-03-10 DIAGNOSIS — I480 Paroxysmal atrial fibrillation: Secondary | ICD-10-CM | POA: Diagnosis not present

## 2020-03-10 NOTE — Patient Instructions (Signed)
Medication Instructions:  Your physician recommends that you continue on your current medications as directed. Please refer to the Current Medication list given to you today.  *If you need a refill on your cardiac medications before your next appointment, please call your pharmacy*   Lab Work: None ordered If you have labs (blood work) drawn today and your tests are completely normal, you will receive your results only by: . MyChart Message (if you have MyChart) OR . A paper copy in the mail If you have any lab test that is abnormal or we need to change your treatment, we will call you to review the results.   Testing/Procedures: None ordered   Follow-Up: At [facility], you and your health needs are our priority.  As part of our continuing mission to provide you with exceptional heart care, we have created designated Provider Care Teams.  These Care Teams include your primary Cardiologist (physician) and Advanced Practice Providers (APPs -  Physician Assistants and Nurse Practitioners) who all work together to provide you with the care you need, when you need it.  We recommend signing up for the patient portal called "MyChart".  Sign up information is provided on this After Visit Summary.  MyChart is used to connect with patients for Virtual Visits (Telemedicine).  Patients are able to view lab/test results, encounter notes, upcoming appointments, etc.  Non-urgent messages can be sent to your provider as well.   To learn more about what you can do with MyChart, go to [email or web address].    Your next appointment:   1 year(s)  The format for your next appointment:   In Person  Provider:   Will Camnitz, MD   Thank you for choosing [facility]!!   Deloria Brassfield, RN [phone]    Other Instructions    

## 2020-03-10 NOTE — Progress Notes (Signed)
Electrophysiology Office Note   Date:  03/10/2020   ID:  Jacob Meadows, Jacob Meadows 18-May-1951, MRN [id number]  PCP:  [name], MD  Primary Electrophysiologist:  Toney Rakes chief complaint on file.    History of Present Illness: Jacob Meadows is a 69 y.o. male who presents today for electrophysiology evaluation.  Jacob Meadows is a 69 y.o. male with history of Paroxysmal AFib, HTN, mild OSA.  He had an atrial fibrillation ablation on 02/23/16.  He tolerated the procedure well.    Today, denies symptoms of palpitations, chest pain, shortness of breath, orthopnea, PND, lower extremity edema, claudication, dizziness, presyncope, syncope, bleeding, or neurologic sequela. The patient is tolerating medications without difficulties.  Overall he is doing well.  He is noted no further episodes of atrial fibrillation.  He is able to do all of his daily activities without restriction.  He has been vaccinated against coronavirus.  Of note, he has adopted a new lab puppy.  This was 1 year ago.  Past Medical History:  Diagnosis Date  . Atrial flutter ([place])    "just the last week or so" (01/29/2016)  . Essential hypertension 11/24/2015  . High cholesterol   . Nonspecific abnormal electrocardiogram (ECG) low voltage limb leads 11/24/2015  . Persistent atrial fibrillation ([place]) 11/24/2015  . Sleep-disordered breathing 11/24/2015   Past Surgical History:  Procedure Laterality Date  . ATRIAL FIBRILLATION ABLATION  02/23/2016  . ATRIAL FLUTTER ABLATION  02/23/2016  . CARDIOVERSION N/A 12/16/2015   Procedure: CARDIOVERSION;  Surgeon: Pixie Casino, MD;  Location: [facility] ENDOSCOPY;  Service: Cardiovascular;  Laterality: N/A;  . CARDIOVERSION  12/2015   "done in ER"  . ELECTROPHYSIOLOGIC STUDY N/A 02/23/2016   Procedure: Atrial Fibrillation Ablation;  Surgeon:  Meredith Leeds, MD;  Location: [facility] LAB;  Service: Cardiovascular;  Laterality: N/A;  . TONSILLECTOMY  1957     Current Outpatient  Medications  Medication Sig Dispense Refill  . atorvastatin (LIPITOR) 10 MG tablet Take 10 mg by mouth daily.    . carvedilol (COREG) 25 MG tablet Take 1 tablet (25 mg total) by mouth 2 (two) times daily. 180 tablet 3  . ELIQUIS 5 MG TABS tablet TAKE 1 TABLET TWICE DAILY. 60 tablet 9  . ibuprofen (ADVIL,MOTRIN) 200 MG tablet Take 200 mg by mouth every 6 (six) hours as needed for moderate pain.    Marland Kitchen losartan (COZAAR) 100 MG tablet TAKE 1 TABLET ONCE DAILY. 30 tablet 9  . Multiple Vitamins-Minerals (CENTRUM SILVER ADULT 50+ PO) Take 1 tablet by mouth daily.     No current facility-administered medications for this visit.    Allergies:   Patient has no known allergies.   Social History:  The patient  reports that he has been smoking cigars. He has smoked for the past 15.00 years. He has never used smokeless tobacco. He reports current alcohol use of about 7.0 - 9.0 standard drinks of alcohol per week. He reports that he does not use drugs.   Family History:  The patient's family history includes Alzheimer's disease in his mother; Atrial fibrillation in his father; Congestive Heart Failure in his father.   ROS:  Please see the history of present illness.   Otherwise, review of systems is positive for none.   All other systems are reviewed and negative.   PHYSICAL EXAM: VS:  BP (!) 144/80   Pulse 66   Ht 5\' 9"  (1.753 m)   Wt 201 lb 9.6 oz (  91.4 kg)   SpO2 98%   BMI 29.77 kg/m  , BMI Body mass index is 29.77 kg/m. GEN: Well nourished, well developed, in no acute distress  HEENT: normal  Neck: no JVD, carotid bruits, or masses Cardiac: RRR; no murmurs, rubs, or gallops,no edema  Respiratory:  clear to auscultation bilaterally, normal work of breathing GI: soft, nontender, nondistended, + BS MS: no deformity or atrophy  Skin: warm and dry Neuro:  Strength and sensation are intact Psych: euthymic mood, full affect  EKG:  EKG is ordered today. Personal review of the ekg ordered shows  sinus rhythm, rate 66, first-degree AV block  Recent Labs: No results found for requested labs within last 8760 hours.    Lipid Panel  No results found for: CHOL, TRIG, HDL, CHOLHDL, VLDL, LDLCALC, LDLDIRECT   Wt Readings from Last 3 Encounters:  03/10/20 201 lb 9.6 oz (91.4 kg)  12/17/18 190 lb 6 oz (86.4 kg)  09/26/17 195 lb (88.5 kg)      Other studies Reviewed: Additional studies/ records that were reviewed today include: TTE 11/27/15, SPECT 01/28/16   Review of the above records today demonstrates:   - Left ventricle: The cavity size was normal. Wall thickness was  increased in a pattern of mild LVH. Systolic function was normal.  The estimated ejection fraction was in the range of 55% to 60%.  Wall motion was normal; there were no regional wall motion  abnormalities. - Left atrium: The atrium was mildly dilated.   Nuclear stress EF: 46%.  There was no ST segment deviation noted during stress.  No T wave inversion was noted during stress.  The study is normal.  This is a low risk study.  The left ventricular ejection fraction is mildly decreased (45-54%).  Mild septal hypokinesis.  ASSESSMENT AND PLAN:  1.  Persistent atrial fibrillation/atrial flutter: Currently on Eliquis.  Status post AF ablation 02/23/2016.  CHA2DS2-VASc of 2.  He fortunately remains in sinus rhythm.  He has no complaints today.  He continues to be able to do all of his daily activities.  No changes.   2. Hypertension: Mildly elevated today.  Is elevated every time he comes into the physician's office.  Better controlled at home.  No changes.  Current medicines are reviewed at length with the patient today.   The patient does not have concerns regarding his medicines.  The following changes were made today: None  Labs/ tests ordered today include:  Orders Placed This Encounter  Procedures  . EKG 12-Lead     Disposition:   FU with   12 months  Signed,  Jorja Loa, MD  03/10/2020 9:05 AM     [facility] [address] [place] [address] [phone] (office) [phone] (fax)

## 2020-03-13 DIAGNOSIS — H3581 Retinal edema: Secondary | ICD-10-CM | POA: Diagnosis not present

## 2020-03-13 DIAGNOSIS — D3131 Benign neoplasm of right choroid: Secondary | ICD-10-CM | POA: Diagnosis not present

## 2020-03-13 DIAGNOSIS — H43823 Vitreomacular adhesion, bilateral: Secondary | ICD-10-CM | POA: Diagnosis not present

## 2020-03-13 DIAGNOSIS — H353132 Nonexudative age-related macular degeneration, bilateral, intermediate dry stage: Secondary | ICD-10-CM | POA: Diagnosis not present

## 2020-03-25 ENCOUNTER — Telehealth: Payer: Self-pay | Admitting: Cardiology

## 2020-03-25 NOTE — Telephone Encounter (Signed)
New Message:    Jacob Meadows needs to know about pt stopping his Eliquis Per Dr Lelon Perla at [facility]  [facility]. When you call have Michellele paged please.  This is not for surgical clearance.

## 2020-03-25 NOTE — Telephone Encounter (Signed)
PCP office called asking if pt can come off Eliquis. Pt would like to stop if able, "he has been in NSR for 4 years". Pt also reported to them that his eye doctor asked if he "could be weaned off so his eye has a better chance of healing up". She is not sure if he had eye surgery or upcoming procedure scheduled but she will f/u w/ pt to find out further details of this request.  Will forward to Dr. Elberta Fortis for advisement of stopping Eliquis. Aware I will call them back once reviewed

## 2020-03-27 NOTE — Telephone Encounter (Signed)
Morrie Sheldon from Dr. Laurey Morale office returning call.

## 2020-03-27 NOTE — Telephone Encounter (Signed)
Spoke to [place] at Dr. Laurey Morale. Informed ok for pt to stop blood thinner if pt wishes.

## 2020-03-27 NOTE — Telephone Encounter (Signed)
lmtcb  (per Dr. Elberta Fortis, ok for pt to stop Eliquis if pt wishes)

## 2020-03-31 NOTE — Telephone Encounter (Signed)
Morrie Sheldon called back to inform me that Dr. Waynard Edwards is agreeable w/ Dr. Elberta Fortis that pt should stay on blood thinner d/t medical hx & CHA2DS2-VASc score of 2. They are going to advised pt to remain on it.

## 2020-05-14 DIAGNOSIS — E7849 Other hyperlipidemia: Secondary | ICD-10-CM | POA: Diagnosis not present

## 2020-05-14 DIAGNOSIS — E559 Vitamin D deficiency, unspecified: Secondary | ICD-10-CM | POA: Diagnosis not present

## 2020-05-14 DIAGNOSIS — Z0000 Encounter for general adult medical examination without abnormal findings: Secondary | ICD-10-CM | POA: Diagnosis not present

## 2020-05-14 DIAGNOSIS — E119 Type 2 diabetes mellitus without complications: Secondary | ICD-10-CM | POA: Diagnosis not present

## 2020-05-14 DIAGNOSIS — Z125 Encounter for screening for malignant neoplasm of prostate: Secondary | ICD-10-CM | POA: Diagnosis not present

## 2020-05-15 DIAGNOSIS — H43812 Vitreous degeneration, left eye: Secondary | ICD-10-CM | POA: Diagnosis not present

## 2020-05-15 DIAGNOSIS — H353132 Nonexudative age-related macular degeneration, bilateral, intermediate dry stage: Secondary | ICD-10-CM | POA: Diagnosis not present

## 2020-05-15 DIAGNOSIS — H43821 Vitreomacular adhesion, right eye: Secondary | ICD-10-CM | POA: Diagnosis not present

## 2020-05-15 DIAGNOSIS — D3131 Benign neoplasm of right choroid: Secondary | ICD-10-CM | POA: Diagnosis not present

## 2020-05-21 DIAGNOSIS — E1169 Type 2 diabetes mellitus with other specified complication: Secondary | ICD-10-CM | POA: Diagnosis not present

## 2020-05-21 DIAGNOSIS — Z1331 Encounter for screening for depression: Secondary | ICD-10-CM | POA: Diagnosis not present

## 2020-05-21 DIAGNOSIS — I4891 Unspecified atrial fibrillation: Secondary | ICD-10-CM | POA: Diagnosis not present

## 2020-05-21 DIAGNOSIS — I10 Essential (primary) hypertension: Secondary | ICD-10-CM | POA: Diagnosis not present

## 2020-05-21 DIAGNOSIS — D6869 Other thrombophilia: Secondary | ICD-10-CM | POA: Diagnosis not present

## 2020-05-21 DIAGNOSIS — R82998 Other abnormal findings in urine: Secondary | ICD-10-CM | POA: Diagnosis not present

## 2020-05-21 DIAGNOSIS — Z0000 Encounter for general adult medical examination without abnormal findings: Secondary | ICD-10-CM | POA: Diagnosis not present

## 2020-08-25 ENCOUNTER — Ambulatory Visit: Payer: Self-pay | Attending: Internal Medicine

## 2020-08-25 DIAGNOSIS — Z23 Encounter for immunization: Secondary | ICD-10-CM

## 2020-08-25 NOTE — Progress Notes (Signed)
° °  Covid-19 Vaccination Clinic  Name:  Jacob Meadows    MRN: [id number] DOB: [birth year]  08/25/2020  [name] was observed post Covid-19 immunization for 15 minutes without incident. He was provided with Vaccine Information Sheet and instruction to access the V-Safe system.   [name] was instructed to call 911 with any severe reactions post vaccine:  Difficulty breathing   Swelling of face and throat   A fast heartbeat   A bad rash all over body   Dizziness and weakness

## 2020-12-03 DIAGNOSIS — F329 Major depressive disorder, single episode, unspecified: Secondary | ICD-10-CM | POA: Diagnosis not present

## 2020-12-03 DIAGNOSIS — R202 Paresthesia of skin: Secondary | ICD-10-CM | POA: Diagnosis not present

## 2020-12-03 DIAGNOSIS — E1169 Type 2 diabetes mellitus with other specified complication: Secondary | ICD-10-CM | POA: Diagnosis not present

## 2020-12-03 DIAGNOSIS — Z23 Encounter for immunization: Secondary | ICD-10-CM | POA: Diagnosis not present

## 2020-12-03 DIAGNOSIS — I10 Essential (primary) hypertension: Secondary | ICD-10-CM | POA: Diagnosis not present

## 2020-12-24 DIAGNOSIS — E1169 Type 2 diabetes mellitus with other specified complication: Secondary | ICD-10-CM | POA: Diagnosis not present

## 2020-12-24 DIAGNOSIS — I10 Essential (primary) hypertension: Secondary | ICD-10-CM | POA: Diagnosis not present

## 2020-12-25 DIAGNOSIS — H43812 Vitreous degeneration, left eye: Secondary | ICD-10-CM | POA: Diagnosis not present

## 2020-12-25 DIAGNOSIS — H43821 Vitreomacular adhesion, right eye: Secondary | ICD-10-CM | POA: Diagnosis not present

## 2020-12-25 DIAGNOSIS — H353112 Nonexudative age-related macular degeneration, right eye, intermediate dry stage: Secondary | ICD-10-CM | POA: Diagnosis not present

## 2020-12-25 DIAGNOSIS — H353221 Exudative age-related macular degeneration, left eye, with active choroidal neovascularization: Secondary | ICD-10-CM | POA: Diagnosis not present

## 2020-12-29 DIAGNOSIS — H353221 Exudative age-related macular degeneration, left eye, with active choroidal neovascularization: Secondary | ICD-10-CM | POA: Diagnosis not present

## 2021-02-01 DIAGNOSIS — H353221 Exudative age-related macular degeneration, left eye, with active choroidal neovascularization: Secondary | ICD-10-CM | POA: Diagnosis not present

## 2021-02-01 DIAGNOSIS — H353112 Nonexudative age-related macular degeneration, right eye, intermediate dry stage: Secondary | ICD-10-CM | POA: Diagnosis not present

## 2021-03-01 DIAGNOSIS — H353221 Exudative age-related macular degeneration, left eye, with active choroidal neovascularization: Secondary | ICD-10-CM | POA: Diagnosis not present

## 2021-03-01 DIAGNOSIS — H353112 Nonexudative age-related macular degeneration, right eye, intermediate dry stage: Secondary | ICD-10-CM | POA: Diagnosis not present

## 2021-04-13 DIAGNOSIS — H353112 Nonexudative age-related macular degeneration, right eye, intermediate dry stage: Secondary | ICD-10-CM | POA: Diagnosis not present

## 2021-04-13 DIAGNOSIS — H353221 Exudative age-related macular degeneration, left eye, with active choroidal neovascularization: Secondary | ICD-10-CM | POA: Diagnosis not present

## 2021-04-13 DIAGNOSIS — H43812 Vitreous degeneration, left eye: Secondary | ICD-10-CM | POA: Diagnosis not present

## 2021-04-13 DIAGNOSIS — H43821 Vitreomacular adhesion, right eye: Secondary | ICD-10-CM | POA: Diagnosis not present

## 2021-04-21 DIAGNOSIS — H6123 Impacted cerumen, bilateral: Secondary | ICD-10-CM | POA: Diagnosis not present

## 2021-04-21 DIAGNOSIS — H9193 Unspecified hearing loss, bilateral: Secondary | ICD-10-CM | POA: Diagnosis not present

## 2021-06-09 DIAGNOSIS — E1169 Type 2 diabetes mellitus with other specified complication: Secondary | ICD-10-CM | POA: Diagnosis not present

## 2021-06-09 DIAGNOSIS — Z125 Encounter for screening for malignant neoplasm of prostate: Secondary | ICD-10-CM | POA: Diagnosis not present

## 2021-06-09 DIAGNOSIS — E785 Hyperlipidemia, unspecified: Secondary | ICD-10-CM | POA: Diagnosis not present

## 2021-06-16 DIAGNOSIS — Z0000 Encounter for general adult medical examination without abnormal findings: Secondary | ICD-10-CM | POA: Diagnosis not present

## 2021-06-16 DIAGNOSIS — R82998 Other abnormal findings in urine: Secondary | ICD-10-CM | POA: Diagnosis not present

## 2021-06-16 DIAGNOSIS — D6869 Other thrombophilia: Secondary | ICD-10-CM | POA: Diagnosis not present

## 2021-06-16 DIAGNOSIS — Z23 Encounter for immunization: Secondary | ICD-10-CM | POA: Diagnosis not present

## 2021-06-16 DIAGNOSIS — Z1331 Encounter for screening for depression: Secondary | ICD-10-CM | POA: Diagnosis not present

## 2021-11-27 ENCOUNTER — Observation Stay (HOSPITAL_COMMUNITY)
Admission: EM | Admit: 2021-11-27 | Discharge: 2021-11-28 | Disposition: A | Discharge status: Home / Self Care | Payer: PPO | Attending: Internal Medicine | Admitting: Internal Medicine

## 2021-11-27 ENCOUNTER — Other Ambulatory Visit (HOSPITAL_COMMUNITY): Payer: Self-pay

## 2021-11-27 DIAGNOSIS — R4702 Dysphasia: Secondary | ICD-10-CM | POA: Diagnosis not present

## 2021-11-27 DIAGNOSIS — R4701 Aphasia: Secondary | ICD-10-CM | POA: Diagnosis not present

## 2021-11-27 DIAGNOSIS — R4182 Altered mental status, unspecified: Secondary | ICD-10-CM | POA: Diagnosis not present

## 2021-11-27 DIAGNOSIS — G9340 Encephalopathy, unspecified: Secondary | ICD-10-CM | POA: Diagnosis not present

## 2021-11-27 DIAGNOSIS — N289 Disorder of kidney and ureter, unspecified: Secondary | ICD-10-CM

## 2021-11-27 DIAGNOSIS — Z79899 Other long term (current) drug therapy: Secondary | ICD-10-CM | POA: Diagnosis not present

## 2021-11-27 DIAGNOSIS — I517 Cardiomegaly: Secondary | ICD-10-CM | POA: Diagnosis not present

## 2021-11-27 DIAGNOSIS — N179 Acute kidney failure, unspecified: Secondary | ICD-10-CM | POA: Insufficient documentation

## 2021-11-27 DIAGNOSIS — I4891 Unspecified atrial fibrillation: Secondary | ICD-10-CM | POA: Diagnosis not present

## 2021-11-27 DIAGNOSIS — Z7901 Long term (current) use of anticoagulants: Secondary | ICD-10-CM | POA: Insufficient documentation

## 2021-11-27 DIAGNOSIS — I10 Essential (primary) hypertension: Secondary | ICD-10-CM | POA: Diagnosis not present

## 2021-11-27 DIAGNOSIS — R410 Disorientation, unspecified: Secondary | ICD-10-CM | POA: Diagnosis not present

## 2021-11-27 DIAGNOSIS — Z9181 History of falling: Secondary | ICD-10-CM | POA: Diagnosis not present

## 2021-11-27 DIAGNOSIS — W19XXXA Unspecified fall, initial encounter: Secondary | ICD-10-CM | POA: Diagnosis not present

## 2021-11-27 DIAGNOSIS — Z20822 Contact with and (suspected) exposure to covid-19: Secondary | ICD-10-CM | POA: Diagnosis not present

## 2021-11-27 DIAGNOSIS — F17290 Nicotine dependence, other tobacco product, uncomplicated: Secondary | ICD-10-CM | POA: Diagnosis not present

## 2021-11-27 DIAGNOSIS — I639 Cerebral infarction, unspecified: Secondary | ICD-10-CM | POA: Diagnosis not present

## 2021-11-27 LAB — I-STAT CHEM 8, ED
BUN: 26 mg/dL — ABNORMAL HIGH (ref 8–23)
Calcium, Ion: 1.06 mmol/L — ABNORMAL LOW (ref 1.15–1.40)
Chloride: 103 mmol/L (ref 98–111)
Creatinine, Ser: 1.8 mg/dL — ABNORMAL HIGH (ref 0.61–1.24)
Glucose, Bld: 224 mg/dL — ABNORMAL HIGH (ref 70–99)
HCT: 47 % (ref 39.0–52.0)
Hemoglobin: 16 g/dL (ref 13.0–17.0)
Potassium: 3.6 mmol/L (ref 3.5–5.1)
Sodium: 137 mmol/L (ref 135–145)
TCO2: 20 mmol/L — ABNORMAL LOW (ref 22–32)

## 2021-11-27 LAB — CBG MONITORING, ED: Glucose-Capillary: 231 mg/dL — ABNORMAL HIGH (ref 70–99)

## 2021-11-27 MED ORDER — SODIUM CHLORIDE 0.9% FLUSH
3.0000 mL | Freq: Once | INTRAVENOUS | Status: AC
  Administered 2021-11-28: 3 mL via INTRAVENOUS

## 2021-11-28 ENCOUNTER — Emergency Department (HOSPITAL_COMMUNITY): Payer: PPO

## 2021-11-28 ENCOUNTER — Other Ambulatory Visit: Payer: Self-pay

## 2021-11-28 ENCOUNTER — Observation Stay (HOSPITAL_COMMUNITY): Payer: PPO

## 2021-11-28 ENCOUNTER — Other Ambulatory Visit (HOSPITAL_COMMUNITY): Payer: Self-pay

## 2021-11-28 DIAGNOSIS — R4182 Altered mental status, unspecified: Secondary | ICD-10-CM | POA: Diagnosis not present

## 2021-11-28 DIAGNOSIS — I480 Paroxysmal atrial fibrillation: Secondary | ICD-10-CM | POA: Diagnosis not present

## 2021-11-28 DIAGNOSIS — G9340 Encephalopathy, unspecified: Secondary | ICD-10-CM | POA: Diagnosis not present

## 2021-11-28 DIAGNOSIS — N189 Chronic kidney disease, unspecified: Secondary | ICD-10-CM | POA: Diagnosis not present

## 2021-11-28 DIAGNOSIS — N289 Disorder of kidney and ureter, unspecified: Secondary | ICD-10-CM | POA: Diagnosis not present

## 2021-11-28 DIAGNOSIS — I639 Cerebral infarction, unspecified: Secondary | ICD-10-CM | POA: Diagnosis not present

## 2021-11-28 DIAGNOSIS — I10 Essential (primary) hypertension: Secondary | ICD-10-CM | POA: Diagnosis not present

## 2021-11-28 DIAGNOSIS — I517 Cardiomegaly: Secondary | ICD-10-CM | POA: Diagnosis not present

## 2021-11-28 DIAGNOSIS — J3489 Other specified disorders of nose and nasal sinuses: Secondary | ICD-10-CM | POA: Diagnosis not present

## 2021-11-28 DIAGNOSIS — R4701 Aphasia: Secondary | ICD-10-CM

## 2021-11-28 DIAGNOSIS — N2889 Other specified disorders of kidney and ureter: Secondary | ICD-10-CM | POA: Diagnosis not present

## 2021-11-28 LAB — CBC
HCT: 43.6 % (ref 39.0–52.0)
HCT: 45.2 % (ref 39.0–52.0)
Hemoglobin: 14.6 g/dL (ref 13.0–17.0)
Hemoglobin: 15.5 g/dL (ref 13.0–17.0)
MCH: 31.9 pg (ref 26.0–34.0)
MCH: 32.8 pg (ref 26.0–34.0)
MCHC: 33.5 g/dL (ref 30.0–36.0)
MCHC: 34.3 g/dL (ref 30.0–36.0)
MCV: 95.4 fL (ref 80.0–100.0)
MCV: 95.6 fL (ref 80.0–100.0)
Platelets: 262 10*3/uL (ref 150–400)
Platelets: 294 10*3/uL (ref 150–400)
RBC: 4.57 MIL/uL (ref 4.22–5.81)
RBC: 4.73 MIL/uL (ref 4.22–5.81)
RDW: 12.5 % (ref 11.5–15.5)
RDW: 12.7 % (ref 11.5–15.5)
WBC: 6.2 10*3/uL (ref 4.0–10.5)
WBC: 8.1 10*3/uL (ref 4.0–10.5)
nRBC: 0 % (ref 0.0–0.2)
nRBC: 0 % (ref 0.0–0.2)

## 2021-11-28 LAB — URINALYSIS, MICROSCOPIC (REFLEX): Bacteria, UA: NONE SEEN

## 2021-11-28 LAB — DIFFERENTIAL
Abs Immature Granulocytes: 0.01 10*3/uL (ref 0.00–0.07)
Basophils Absolute: 0.1 10*3/uL (ref 0.0–0.1)
Basophils Relative: 1 %
Eosinophils Absolute: 0.4 10*3/uL (ref 0.0–0.5)
Eosinophils Relative: 5 %
Immature Granulocytes: 0 %
Lymphocytes Relative: 33 %
Lymphs Abs: 2.6 10*3/uL (ref 0.7–4.0)
Monocytes Absolute: 0.8 10*3/uL (ref 0.1–1.0)
Monocytes Relative: 10 %
Neutro Abs: 4.1 10*3/uL (ref 1.7–7.7)
Neutrophils Relative %: 51 %

## 2021-11-28 LAB — URINALYSIS, ROUTINE W REFLEX MICROSCOPIC
Bilirubin Urine: NEGATIVE
Glucose, UA: >=500 mg/dL — AB
Ketones, ur: NEGATIVE mg/dL
Leukocytes,Ua: NEGATIVE
Nitrite: NEGATIVE
Protein, ur: NEGATIVE mg/dL
Specific Gravity, Urine: <1.005 — ABNORMAL LOW (ref 1.005–1.030)
pH: 5.5 (ref 5.0–8.0)

## 2021-11-28 LAB — COMPREHENSIVE METABOLIC PANEL
ALT: 20 U/L (ref 0–44)
AST: 11 U/L — ABNORMAL LOW (ref 15–41)
Albumin: 3.7 g/dL (ref 3.5–5.0)
Alkaline Phosphatase: 62 U/L (ref 38–126)
Anion gap: 15 (ref 5–15)
BUN: 24 mg/dL — ABNORMAL HIGH (ref 8–23)
CO2: 20 mmol/L — ABNORMAL LOW (ref 22–32)
Calcium: 9 mg/dL (ref 8.9–10.3)
Chloride: 102 mmol/L (ref 98–111)
Creatinine, Ser: 1.55 mg/dL — ABNORMAL HIGH (ref 0.61–1.24)
GFR, Estimated: 47 mL/min — ABNORMAL LOW (ref >60)
Glucose, Bld: 230 mg/dL — ABNORMAL HIGH (ref 70–99)
Potassium: 3.6 mmol/L (ref 3.5–5.1)
Sodium: 137 mmol/L (ref 135–145)
Total Bilirubin: 0.6 mg/dL (ref 0.3–1.2)
Total Protein: 6.4 g/dL — ABNORMAL LOW (ref 6.5–8.1)

## 2021-11-28 LAB — CK: Total CK: 52 U/L (ref 49–397)

## 2021-11-28 LAB — RESP PANEL BY RT-PCR (FLU A&B, COVID) ARPGX2
Influenza A by PCR: NEGATIVE
Influenza B by PCR: NEGATIVE
SARS Coronavirus 2 by RT PCR: NEGATIVE

## 2021-11-28 LAB — BASIC METABOLIC PANEL
Anion gap: 14 (ref 5–15)
BUN: 23 mg/dL (ref 8–23)
CO2: 20 mmol/L — ABNORMAL LOW (ref 22–32)
Calcium: 8.6 mg/dL — ABNORMAL LOW (ref 8.9–10.3)
Chloride: 103 mmol/L (ref 98–111)
Creatinine, Ser: 1.3 mg/dL — ABNORMAL HIGH (ref 0.61–1.24)
GFR, Estimated: 58 mL/min — ABNORMAL LOW (ref >60)
Glucose, Bld: 252 mg/dL — ABNORMAL HIGH (ref 70–99)
Potassium: 4.1 mmol/L (ref 3.5–5.1)
Sodium: 137 mmol/L (ref 135–145)

## 2021-11-28 LAB — PROTIME-INR
INR: 1.1 (ref 0.8–1.2)
Prothrombin Time: 13.9 seconds (ref 11.4–15.2)

## 2021-11-28 LAB — APTT: aPTT: 28 seconds (ref 24–36)

## 2021-11-28 LAB — RAPID URINE DRUG SCREEN, HOSP PERFORMED
Amphetamines: NOT DETECTED
Barbiturates: NOT DETECTED
Benzodiazepines: NOT DETECTED
Cocaine: NOT DETECTED
Opiates: NOT DETECTED
Tetrahydrocannabinol: NOT DETECTED

## 2021-11-28 LAB — TROPONIN I (HIGH SENSITIVITY): Troponin I (High Sensitivity): 9 ng/L (ref <18)

## 2021-11-28 LAB — VITAMIN B12: Vitamin B-12: 393 pg/mL (ref 180–914)

## 2021-11-28 LAB — TSH: TSH: 1.764 u[IU]/mL (ref 0.350–4.500)

## 2021-11-28 LAB — AMMONIA: Ammonia: 31 umol/L (ref 9–35)

## 2021-11-28 MED ORDER — ACETAMINOPHEN 650 MG RE SUPP: 650.0000 mg | Freq: Four times a day (QID) | RECTAL | Status: DC | PRN

## 2021-11-28 MED ORDER — ATORVASTATIN CALCIUM 10 MG PO TABS
10.0000 mg | ORAL_TABLET | Freq: Every day | ORAL | Status: DC
  Administered 2021-11-28: 10 mg via ORAL
  Filled 2021-11-28: qty 1

## 2021-11-28 MED ORDER — CARVEDILOL 12.5 MG PO TABS
25.0000 mg | ORAL_TABLET | Freq: Two times a day (BID) | ORAL | Status: DC
  Administered 2021-11-28: 25 mg via ORAL
  Filled 2021-11-28: qty 2

## 2021-11-28 MED ORDER — ONDANSETRON HCL 4 MG/2ML IJ SOLN: 4.0000 mg | Freq: Four times a day (QID) | INTRAMUSCULAR | Status: DC | PRN

## 2021-11-28 MED ORDER — DIAZEPAM 5 MG/ML IJ SOLN
2.5000 mg | Freq: Once | INTRAMUSCULAR | Status: AC
Start: 2021-11-28 — End: 2021-11-28
  Administered 2021-11-28: 2.5 mg via INTRAVENOUS
  Filled 2021-11-28: qty 2

## 2021-11-28 MED ORDER — IOHEXOL 350 MG/ML SOLN
50.0000 mL | Freq: Once | INTRAVENOUS | Status: AC | PRN
  Administered 2021-11-28: 50 mL via INTRAVENOUS

## 2021-11-28 MED ORDER — IOHEXOL 350 MG/ML SOLN
75.0000 mL | Freq: Once | INTRAVENOUS | Status: AC | PRN
  Administered 2021-11-28: 75 mL via INTRAVENOUS

## 2021-11-28 MED ORDER — LACTATED RINGERS IV SOLN: INTRAVENOUS | Status: DC

## 2021-11-28 MED ORDER — ACETAMINOPHEN 325 MG PO TABS
650.0000 mg | ORAL_TABLET | Freq: Four times a day (QID) | ORAL | Status: DC | PRN
  Administered 2021-11-28: 650 mg via ORAL
  Filled 2021-11-28: qty 2

## 2021-11-28 MED ORDER — ONDANSETRON HCL 4 MG PO TABS: 4.0000 mg | ORAL_TABLET | Freq: Four times a day (QID) | ORAL | Status: DC | PRN

## 2021-11-28 MED ORDER — APIXABAN 5 MG PO TABS
5.0000 mg | ORAL_TABLET | Freq: Two times a day (BID) | ORAL | Status: DC
  Administered 2021-11-28: 5 mg via ORAL
  Filled 2021-11-28: qty 1

## 2021-11-28 NOTE — ED Triage Notes (Signed)
Pt arrived by EMS for a code stroke. Per EMS, family reported pt fell at home around 32 and a flat screen television fell on him. Last known normal was 2100. Pt alert upon arrival to ED with aphasia.

## 2021-11-28 NOTE — Progress Notes (Signed)
Orthopedic Tech Progress Note Patient Details:  SHANAN MCMILLER 02-Feb-1951 [id number]  Patient ID: Barbette Merino, male   DOB: [birth year], 71 y.o.   MRN: [id number] Attended trauma page Trinna Post 11/28/2021, 12:13 AM

## 2021-11-28 NOTE — Consult Note (Signed)
NEUROLOGY CONSULTATION NOTE   Date of service: November 28, 2021 Patient Name: Jacob Meadows MRN:  [id number] DOB:  June 22, 1951 Reason for consult: "Stroke code for aphasia" Requesting Provider: Fatima Blank, * _ _ _   _ __   _ __ _ _  __ __   _ __   __ _  History of Present Illness  Jacob Meadows is a 71 y.o. male with PMH significant for Afibb on Eliquis, HTN, mild OSA, HLD, who presents with aphasia out of proportion to encephalopathy.  History obtained from step daughter(Sydney: [phone]) Ronny Flurry: [phone] lives in [place]. who reports that he was fine when she left at 2100. He was half asleep when they got home at 2230 on 11/27/20. She and her friend's heard a crash at 2300 and found him on the floor in the living room and TV had fallen next to him. He could not sit up on his own. They got him up on couch, was confused and not forming coherent sentences. Per chart review, he is on Eliquis, per step daughter he takes a blood thinner but she is not sure. She tells me that he takes all his meds on time. On review of fill history, he got a 90 day supply of eliquis from pharmacy in Nov 2022.  Was sick a week or 2 ago with cold like symptoms.  Lost his wife in July 2022.  LKW: 2100 on 11/27/20 mRS: 0 tNKASE: not offered, presumed to be taking eliquis Thrombectomy: not offered, no LVO NIHSS components Score: Comment  1a Level of Conscious 0[x]  1[]  2[]  3[]      1b LOC Questions 0[x]  1[]  2[]       1c LOC Commands 0[x]  1[]  2[]       2 Best Gaze 0[x]  1[]  2[]       3 Visual 0[x]  1[]  2[]  3[]      4 Facial Palsy 0[x]  1[]  2[]  3[]      5a Motor Arm - left 0[x]  1[]  2[]  3[]  4[]  UN[]    5b Motor Arm - Right 0[x]  1[]  2[]  3[]  4[]  UN[]    6a Motor Leg - Left 0[x]  1[]  2[]  3[]  4[]  UN[]    6b Motor Leg - Right 0[x]  1[]  2[]  3[]  4[]  UN[]    7 Limb Ataxia 0[x]  1[]  2[]  3[]  UN[]     8 Sensory 0[x]  1[]  2[]  UN[]      9 Best Language 0[]  1[]  2[x]  3[]    Aphasia, prominent when having a conversation with  him  10 Dysarthria 0[x]  1[]  2[]  UN[]      11 Extinct. and Inattention 0[x]  1[]  2[]       TOTAL: 2      ROS   Constitutional Denies weight loss, fever and chills.   HEENT Denies changes in vision and hearing.   Respiratory Denies SOB and cough.   CV Denies palpitations and CP   GI Denies abdominal pain, nausea, vomiting and diarrhea.   GU Denies dysuria and urinary frequency.   MSK Denies myalgia and joint pain.   Skin Denies rash and pruritus.   Neurological Denies headache and syncope.   Psychiatric Denies recent changes in mood. Denies anxiety and depression.    Past History   Past Medical History:  Diagnosis Date   Atrial flutter ([place])    "just the last week or so" (01/29/2016)   Essential hypertension 11/24/2015   High cholesterol    Nonspecific abnormal electrocardiogram (ECG) low voltage limb leads 11/24/2015   Persistent atrial fibrillation ([place]) 11/24/2015   Sleep-disordered  breathing 11/24/2015   Past Surgical History:  Procedure Laterality Date   ATRIAL FIBRILLATION ABLATION  02/23/2016   ATRIAL FLUTTER ABLATION  02/23/2016   CARDIOVERSION N/A 12/16/2015   Procedure: CARDIOVERSION;  Surgeon: Pixie Casino, MD;  Location: [facility] ENDOSCOPY;  Service: Cardiovascular;  Laterality: N/A;   CARDIOVERSION  12/2015   "done in ER"   ELECTROPHYSIOLOGIC STUDY N/A 02/23/2016   Procedure: Atrial Fibrillation Ablation;  Surgeon: Will Meredith Leeds, MD;  Location: [facility];  Service: Cardiovascular;  Laterality: N/A;   TONSILLECTOMY  1957   Family History  Problem Relation Age of Onset   Alzheimer's disease Mother    Congestive Heart Failure Father    Atrial fibrillation Father    Social History   Socioeconomic History   Marital status: Married    Spouse name: Not on file   Number of children: Not on file   Years of education: Not on file   Highest education level: Not on file  Occupational History   Not on file  Tobacco Use   Smoking status: Some Days    Types: Cigars    Smokeless tobacco: Never  Vaping Use   Vaping Use: Never used  Substance and Sexual Activity   Alcohol use: Yes    Alcohol/week: 7.0 - 9.0 standard drinks    Types: 4 Glasses of wine, 2 Shots of liquor, 1 - 3 Standard drinks or equivalent per week   Drug use: No   Sexual activity: Yes  Other Topics Concern   Not on file  Social History Narrative   Not on file   Social Determinants of Health   Financial Resource Strain: Not on file  Food Insecurity: Not on file  Transportation Needs: Not on file  Physical Activity: Not on file  Stress: Not on file  Social Connections: Not on file   No Known Allergies  Medications  (Not in a hospital admission)    Vitals   Vitals:   11/27/21 2300  Weight: 97.8 kg  Height: 5\' 9"  (1.753 m)     Body mass index is 31.82 kg/m.  Physical Exam   General: Laying comfortably in bed; in no acute distress.  HENT: Normal oropharynx and mucosa. Normal external appearance of ears and nose.  Neck: Supple, no pain or tenderness  CV: No JVD. No peripheral edema.  Pulmonary: Symmetric Chest rise. Normal respiratory effort.  Abdomen: Soft to touch, non-tender.  Ext: No cyanosis, edema, or deformity  Skin: No rash. Normal palpation of skin.   Musculoskeletal: Normal digits and nails by inspection. No clubbing.   Neurologic Examination  Mental status/Cognition: Alert, oriented to self, place, month and year, poor attention.  Speech/language: Fluent but gets stuck on several words. comprehension intact intermittently, able to name some objects, difficulty with repetition. Aphasia becomes more obvious when having a conversation with him. Cranial nerves:   CN II Pupils equal and reactive to light, no VF deficits    CN III,IV,VI EOM intact, no gaze preference or deviation, no nystagmus    CN V normal sensation in V1, V2, and V3 segments bilaterally    CN VII no asymmetry, no nasolabial fold flattening    CN VIII normal hearing to speech    CN IX & X  normal palatal elevation, no uvular deviation    CN XI 5/5 head turn and 5/5 shoulder shrug bilaterally    CN XII midline tongue protrusion    Motor:  Muscle bulk: normal, tone normal, pronator drift none  tremor none Mvmt Root Nerve  Muscle Right Left Comments  SA C5/6 Ax Deltoid 5 5   EF C5/6 Mc Biceps 5 5   EE C6/7/8 Rad Triceps 5 5   WF C6/7 Med FCR     WE C7/8 PIN ECU     F Ab C8/T1 U ADM/FDI 5 5   HF L1/2/3 Fem Illopsoas 5 5   KE L2/3/4 Fem Quad 5 5   DF L4/5 D Peron Tib Ant 5 5   PF S1/2 Tibial Grc/Sol 5 5    Reflexes:  Right Left Comments  Pectoralis      Biceps (C5/6) 2 2   Brachioradialis (C5/6) 2 2    Triceps (C6/7) 2 2    Patellar (L3/4) 2 2    Achilles (S1)      Hoffman      Plantar     Jaw jerk    Sensation:  Light touch Intact throughout   Pin prick    Temperature    Vibration   Proprioception    Coordination/Complex Motor:  - Finger to Nose intact BL - Heel to shin intact BL - Rapid alternating movement are normal - Gait: deferred.  Labs   CBC:  Recent Labs  Lab 11/27/21 2350 11/27/21 2356  WBC 8.1  --   NEUTROABS 4.1  --   HGB 15.5 16.0  HCT 45.2 47.0  MCV 95.6  --   PLT 294  --     Basic Metabolic Panel:  Lab Results  Component Value Date   NA 137 11/27/2021   K 3.6 11/27/2021   CO2 24 05/09/2016   GLUCOSE 224 (H) 11/27/2021   BUN 26 (H) 11/27/2021   CREATININE 1.80 (H) 11/27/2021   CALCIUM 9.3 05/09/2016   GFRNONAA >60 05/09/2016   GFRAA >60 05/09/2016   Lipid Panel: No results found for: LDLCALC HgbA1c:  Lab Results  Component Value Date   HGBA1C 7.1 (H) 01/30/2016   Urine Drug Screen: No results found for: LABOPIA, COCAINSCRNUR, LABBENZ, AMPHETMU, THCU, LABBARB  Alcohol Level No results found for: [place]  CT Head without contrast(Personally reviewed): CTH was negative for a large hypodensity concerning for a large territory infarct or hyperdensity concerning for an ICH  CT angio Head and Neck with contrast(Personally  reviewed): No LVO  CT Perfusion: No mismatch  MRI Brain(Personally reviewed): pending  rEEG:  pending  Impression   RAGEN MACARTHUR is a 71 y.o. male with PMH significant for Afibb on Eliquis, HTN, mild OSA, HLD, who presents with acute onset aphasia out of proportion to encephalopathy and fall. His neurologic examination is notable for aphasis out of proportion to encephalopathy.  Differential includes: Stroke, possible infectious/metabolic/nutritional abnormalities or medication induced.  Impression: Toxic metabolic encephalopathy Aphasia that is out of proportion to encephalopathy  Recommendations  - MRI Brain without contrast - routine EEG - Recommend infectious workup with CBC, chemistry, UA, CXR and lactate - Recommend UDS, Ammonia, B12 with MMA, folate, TSH.  Plan discussed with Dr. Leonette Monarch. ______________________________________________________________________   Thank you for the opportunity to take part in the care of this patient. If you have any further questions, please contact the neurology consultation attending.  Signed,  [place] Pager Number [phone] _ _ _   _ __   _ __ _ _  __ __   _ __   __ _

## 2021-11-28 NOTE — Progress Notes (Signed)
Introduced myself to patient. Patient confused and somewhat disoriented. Provided him with a warm blanket. Told him that his daughter had arrived and asked if he would like for me to escort her back to his room, which he answered in the affirmative. Met daughter, Jacob Meadows and two family friends in E.D. waiting area. She was talking on cell phone, so did not have opportunity to speak with her directly. Escorted daughter and friend to father's bedside. Excused myself, in order that nurse could maintain continuum of care. Told patient and daughter that I would be available if they desired further support. °

## 2021-11-28 NOTE — Evaluation (Signed)
Physical Therapy Evaluation & Discharge Patient Details Name: Jacob Meadows MRN: [id number] DOB: May 24, 1951 Today's Date: 11/28/2021  History of Present Illness  Pt is a 71 y.o. male who presented 11/27/21 s/p fall at home and a flat screen television fell on him. Pt also with confusion, unable to sit up, and with aphasia. tNKASE not administered. MRI negative for acute intracranial abnormality. Pt being worked up for toxic metabolic encephalopathy. PMH: HTN, A.Fib on Eliquis, HTN   Clinical Impression  Pt presents with condition above. He appears to be back at his baseline cognitively and functionally, displaying safe independent dynamic gait without LOB or assistance. He lives with his family in a 1-level house with 2 STE. All education completed and questions answered. PT will sign off.       Recommendations for follow up therapy are one component of a multi-disciplinary discharge planning process, led by the attending physician.  Recommendations may be updated based on patient status, additional functional criteria and insurance authorization.  Follow Up Recommendations No PT follow up    Assistance Recommended at Discharge None  Patient can return home with the following       Equipment Recommendations None recommended by PT  Recommendations for Other Services       Functional Status Assessment Patient has not had a recent decline in their functional status     Precautions / Restrictions Precautions Precautions: None Restrictions Weight Bearing Restrictions: No      Mobility  Bed Mobility Overal bed mobility: Modified Independent             General bed mobility comments: Pt able to perform all bed mobility aspects safely without assistance.    Transfers Overall transfer level: Independent Equipment used: None               General transfer comment: Able to come to stand without LOB or difficulty.    Ambulation/Gait Ambulation/Gait assistance:  Independent Gait Distance (Feet): 250 Feet Assistive device: None Gait Pattern/deviations: WFL(Within Functional Limits) Gait velocity: WNL Gait velocity interpretation: >4.37 ft/sec, indicative of normal walking speed   General Gait Details: No significant gait deviations. No LOB, even with changes in direction, head position, and speed.  Stairs            Wheelchair Mobility    Modified Rankin (Stroke Patients Only) Modified Rankin (Stroke Patients Only) Pre-Morbid Rankin Score: No symptoms Modified Rankin: No symptoms     Balance Overall balance assessment: No apparent balance deficits (not formally assessed)                                           Pertinent Vitals/Pain Pain Assessment: No/denies pain    Home Living Family/patient expects to be discharged to:: Private residence Living Arrangements: Children Available Help at Discharge: Family Type of Home: House Home Access: Stairs to enter Entrance Stairs-Rails: Chemical engineer of Steps: 2 steps   Home Layout: One level Home Equipment: Cane - single point      Prior Function Prior Level of Function : Independent/Modified Independent;Driving             Mobility Comments: indep ADLs Comments: indep     Hand Dominance   Dominant Hand: Right    Extremity/Trunk Assessment   Upper Extremity Assessment Upper Extremity Assessment: Defer to OT evaluation    Lower Extremity Assessment Lower Extremity Assessment: Overall  WFL for tasks assessed (MMT scores of 5 grossly; denies numbness/tingling)    Cervical / Trunk Assessment Cervical / Trunk Assessment: Normal  Communication   Communication: No difficulties  Cognition Arousal/Alertness: Awake/alert Behavior During Therapy: WFL for tasks assessed/performed Overall Cognitive Status: Within Functional Limits for tasks assessed                                 General Comments: Pt has some memory  loss around fall, otherwise memory/cognition appear intact.        General Comments      Exercises     Assessment/Plan    PT Assessment Patient does not need any further PT services  PT Problem List         PT Treatment Interventions      PT Goals (Current goals can be found in the Care Plan section)  Acute Rehab PT Goals Patient Stated Goal: to go home PT Goal Formulation: All assessment and education complete, DC therapy Time For Goal Achievement: 11/29/21 Potential to Achieve Goals: Good    Frequency       Co-evaluation PT/OT/SLP Co-Evaluation/Treatment: Yes Reason for Co-Treatment: To address functional/ADL transfers (pt d/c'ing) PT goals addressed during session: Mobility/safety with mobility;Balance         AM-PAC PT "6 Clicks" Mobility  Outcome Measure Help needed turning from your back to your side while in a flat bed without using bedrails?: None Help needed moving from lying on your back to sitting on the side of a flat bed without using bedrails?: None Help needed moving to and from a bed to a chair (including a wheelchair)?: None Help needed standing up from a chair using your arms (e.g., wheelchair or bedside chair)?: None Help needed to walk in hospital room?: None Help needed climbing 3-5 steps with a railing? : None 6 Click Score: 24    End of Session   Activity Tolerance: Patient tolerated treatment well Patient left: in bed Nurse Communication: Mobility status PT Visit Diagnosis: History of falling (Z91.81)    Time: [id number] PT Time Calculation (min) (ACUTE ONLY): 8 min   Charges:   PT Evaluation $PT Eval Low Complexity: 1 Low          Moishe Spice, PT, DPT Acute Rehabilitation Services  Pager: [phone] Office: [phone]   Orvan Falconer 11/28/2021, 2:27 PM

## 2021-11-28 NOTE — Discharge Summary (Signed)
PATIENT DETAILS Name: Jacob Meadows Age: 71 y.o. Sex: male Date of Birth: [birth year] MRN: [id number]. Admitting Physician: Etta Quill, DO [id number], Elta Guadeloupe, MD  Admit Date: 11/27/2021 Discharge date: 11/28/2021  Recommendations for Outpatient Follow-up:  Follow up with PCP in 1-2 weeks Please obtain CMP/CBC in one week Please ensure follow-up with neurology and cardiology.  Admitted From:  Home  Disposition: [place]: No  Equipment/Devices: None  Discharge Condition: Stable  CODE STATUS: FULL CODE  Diet recommendation:  Diet Order             Diet Heart Room service appropriate? Yes; Fluid consistency: Thin  Diet effective now           Diet - low sodium heart healthy                    Brief Summary: 71 year old with history of A. fib on Eliquis-presented with a fall-(family at bedside suspects it was a syncopal episode)-he was then found to be confused and aphasic for several hours.   Brief Hospital Course: Aphasia: Resolved-neuroimaging including CT/MRI studies were negative.  EEG was planned-however patient wanting to be discharged home-and wants to pursue this in the outpatient setting, neurologist Dr. Quinn Axe consulted this morning-okay for discharge-ambulatory referral for neurology placed for outpatient EEG.  Fall-question syncope: Unclear etiology-patient unclear exactly what happened-he was somewhat confused for several hours.  Neuroimaging negative-plans were for echo/telemetry monitoring-however patient now wanting to go home (has been in a hallway bed since admission).  Orthostatic vital signs were negative.  He was ambulated in the ED by nursing staff without any major issues.  Patient to call his primary cardiologist-for further outpatient eval including echocardiogram.  PAF: Per patient-he is status post ablation several years ago-2 months back he felt he was going in and out of A. fib.  I have asked him to continue Eliquis and  follow-up with his primary EP physician.  Rest of the patient's medical problems were stable during the short overnight hospitalization.   Obesity: Estimated body mass index is 31.82 kg/m as calculated from the following:   Height as of this encounter: 5\' 9"  (1.753 m).   Weight as of this encounter: 97.8 kg.   RN pressure injury documentation:    Procedures None  Discharge Diagnoses:  Principal Problem:   Acute encephalopathy Active Problems:   Essential hypertension   AF (atrial fibrillation) (HCC)   Renal insufficiency   Discharge Instructions:  Activity:  As tolerated    Discharge Instructions     Ambulatory referral to Neurology   Complete by: As directed    An appointment is requested in approximately: 4-6 wks   Diet - low sodium heart healthy   Complete by: As directed    Discharge instructions   Complete by: As directed    Follow with Primary MD  [name], MD in 1-2 weeks  Please get a complete blood count and chemistry panel checked by your Primary MD at your next visit, and again as instructed by your Primary MD.  Get Medicines reviewed and adjusted: Please take all your medications with you for your next visit with your Primary MD  Laboratory/radiological data: Please request your Primary MD to go over all hospital tests and procedure/radiological results at the follow up, please ask your Primary MD to get all Hospital records sent to his/her office.  In some cases, they will be blood work, cultures and biopsy results pending at the time  of your discharge. Please request that your primary care M.D. follows up on these results.  Also Note the following: If you experience worsening of your admission symptoms, develop shortness of breath, life threatening emergency, suicidal or homicidal thoughts you must seek medical attention immediately by calling 911 or calling your MD immediately  if symptoms less severe.  You must read complete  instructions/literature along with all the possible adverse reactions/side effects for all the Medicines you take and that have been prescribed to you. Take any new Medicines after you have completely understood and accpet all the possible adverse reactions/side effects.   Do not drive when taking Pain medications or sleeping medications (Benzodaizepines)  Do not take more than prescribed Pain, Sleep and Anxiety Medications. It is not advisable to combine anxiety,sleep and pain medications without talking with your primary care practitioner  Special Instructions: If you have smoked or chewed Tobacco  in the last 2 yrs please stop smoking, stop any regular Alcohol  and or any Recreational drug use.  Wear Seat belts while driving.  Please note: You were cared for by a hospitalist during your hospital stay. Once you are discharged, your primary care physician will handle any further medical issues. Please note that NO REFILLS for any discharge medications will be authorized once you are discharged, as it is imperative that you return to your primary care physician (or establish a relationship with a primary care physician if you do not have one) for your post hospital discharge needs so that they can reassess your need for medications and monitor your lab values.   1.  You will get a call from neurology office for outpatient EEG and evaluation.  2.  Please call your primary cardiologist and get an echocardiogram scheduled.   Increase activity slowly   Complete by: As directed       Allergies as of 11/28/2021   No Known Allergies      Medication List     TAKE these medications    atorvastatin 10 MG tablet Commonly known as: LIPITOR Take 10 mg by mouth daily.   carvedilol 25 MG tablet Commonly known as: COREG Take 1 tablet (25 mg total) by mouth 2 (two) times daily.   CENTRUM SILVER ADULT 50+ PO Take 1 tablet by mouth daily.   Eliquis 5 MG Tabs tablet Generic drug: apixaban TAKE 1  TABLET TWICE DAILY. What changed: how much to take   losartan 100 MG tablet Commonly known as: COZAAR TAKE 1 TABLET ONCE DAILY.        Follow-up Information     [name], MD. Schedule an appointment as soon as possible for a visit in 1 week(s).   Specialty: Internal Medicine Contact information: [place] [address] [phone]         Constance Haw, MD Follow up in 1 week(s).   Specialty: Cardiology Why: Hospital follow up Contact information: [address] [place] [address] [phone]                No Known Allergies    Consultations: Neurology   Other Procedures/Studies: MR BRAIN WO CONTRAST  Result Date: 11/28/2021 CLINICAL DATA:  71 year old male code stroke presentation. Fall and blunt trauma. Aphasia. Unrevealing CTA and CTP. EXAM: MRI HEAD WITHOUT CONTRAST TECHNIQUE: Multiplanar, multiecho pulse sequences of the brain and surrounding structures were obtained without intravenous contrast. COMPARISON:  Head CT, CTA and CTP 0022 hours today. FINDINGS: Brain: No restricted diffusion to  suggest acute infarction. No midline shift, mass effect, evidence of mass lesion, extra-axial collection or acute intracranial hemorrhage. Cervicomedullary junction and pituitary are within normal limits. Ventricular prominence is nonspecific but may be ex vacuo in nature. No obvious hyperdynamic flow at the cerebral aqueduct. Subtle areas of chronic cortical encephalomalacia in the bilateral PCA territories affecting the medial left and lateral right occipital lobes on series 11, image 13. No chronic cerebral blood products identified. Small chronic right superior cerebellar infarct on series 10, image 7. Otherwise largely normal for age gray and white matter signal throughout the brain. Vascular: Major intracranial vascular flow voids are preserved. Skull and upper cervical spine: Negative for age visible cervical spine. Visualized  bone marrow signal is within normal limits. Sinuses/Orbits: Right maxillary sinus disease redemonstrated. Anterior ethmoid mucosal thickening also stable. Negative orbits. Other: Mastoids are clear. Visible internal auditory structures appear normal. Scalp and face appear negative. IMPRESSION: 1. No acute intracranial abnormality. 2. Subtle chronic bilateral PCA territory infarcts, and small chronic right cerebellar infarct. 3. Ventricular prominence is nonspecific but may be ex vacuo in nature. Electronically Signed   By: Genevie Ann M.D.   On: 11/28/2021 06:34   US RENAL  Result Date: 11/28/2021 CLINICAL DATA:  Renal insufficiency EXAM: RENAL / URINARY TRACT ULTRASOUND COMPLETE COMPARISON:  None. FINDINGS: Right Kidney: Renal measurements: 10.4 x 4.8 x 4.9 cm = volume: 129 mL. Echogenicity within normal limits. No mass or hydronephrosis visualized. Left Kidney: Renal measurements: 11.6 x 5.3 x 5.5 cm = volume: 178 mL. Echogenicity within normal limits. No mass or hydronephrosis visualized. Bladder: Appears normal for degree of bladder distention. Other: None. IMPRESSION: No acute findings.  No hydronephrosis. Electronically Signed   By: Rolm Baptise M.D.   On: 11/28/2021 03:24   CT CEREBRAL PERFUSION W CONTRAST  Result Date: 11/28/2021 CLINICAL DATA:  Aphasia EXAM: CT HEAD CODE STROKE CT ANGIOGRAPHY HEAD AND NECK CT PERFUSION BRAIN TECHNIQUE: Multidetector CT imaging of the head was performed using the standard protocol without administration of intravenous contrast Multidetector CT imaging of the head and neck was performed using the standard protocol during bolus administration of intravenous contrast. Multiplanar CT image reconstructions and MIPs were obtained to evaluate the vascular anatomy. Carotid stenosis measurements (when applicable) are obtained utilizing NASCET criteria, using the distal internal carotid diameter as the denominator. Multiphase CT imaging of the brain was performed following IV bolus  contrast injection. Subsequent parametric perfusion maps were calculated using RAPID software. CONTRAST:  61mL OMNIPAQUE IOHEXOL 350 MG/ML SOLN; 6mL OMNIPAQUE IOHEXOL 350 MG/ML SOLN COMPARISON:  None. FINDINGS: CT HEAD FINDINGS Brain: There is no mass, hemorrhage or extra-axial collection. There is generalized atrophy without lobar predilection. The brain parenchyma is normal, without evidence of acute or chronic infarction. Vascular: No abnormal hyperdensity of the major intracranial arteries or dural venous sinuses. No intracranial atherosclerosis. Skull: The visualized skull base, calvarium and extracranial soft tissues are normal. Sinuses/Orbits: Complete opacification of the right maxillary sinus. The orbits are normal. ASPECTS ([place] Stroke Program Early CT Score) - Ganglionic level infarction (caudate, lentiform nuclei, internal capsule, insula, M1-M3 cortex): 7 - Supraganglionic infarction (M4-M6 cortex): 3 Total score (0-10 with 10 being normal): 10 These results were called by telephone at the time of interpretation on 11/28/2021 at 12:15 am to provider [facility] , who verbally acknowledged these results. CTA NECK FINDINGS SKELETON: There is no bony spinal canal stenosis. No lytic or blastic lesion. OTHER NECK: Normal pharynx, larynx and major salivary glands. No cervical  lymphadenopathy. Unremarkable thyroid gland. UPPER CHEST: No pneumothorax or pleural effusion. No nodules or masses. AORTIC ARCH: There is no calcific atherosclerosis of the aortic arch. There is no aneurysm, dissection or hemodynamically significant stenosis of the visualized portion of the aorta. Conventional 3 vessel aortic branching pattern. The visualized proximal subclavian arteries are widely patent. RIGHT CAROTID SYSTEM: Normal without aneurysm, dissection or stenosis. LEFT CAROTID SYSTEM: Normal without aneurysm, dissection or stenosis. VERTEBRAL ARTERIES: Left dominant configuration. Both origins are clearly patent.  There is no dissection, occlusion or flow-limiting stenosis to the skull base (V1-V3 segments). CTA HEAD FINDINGS POSTERIOR CIRCULATION: --Vertebral arteries: Normal V4 segments. --Inferior cerebellar arteries: Normal. --Basilar artery: Normal. --Superior cerebellar arteries: Normal. --Posterior cerebral arteries (PCA): Normal. ANTERIOR CIRCULATION: --Intracranial internal carotid arteries: Normal. --Anterior cerebral arteries (ACA): Normal. Both A1 segments are present. Patent anterior communicating artery (a-comm). --Middle cerebral arteries (MCA): Normal. VENOUS SINUSES: As permitted by contrast timing, patent. ANATOMIC VARIANTS: None Review of the MIP images confirms the above findings. CT Brain Perfusion Findings: ASPECTS: 10 CBF (<30%) Volume: 44mL Perfusion (Tmax>6.0s) volume: 20mL Mismatch Volume: 59mL Infarction Location:None IMPRESSION: 1. No intracranial hemorrhage. ASPECTS is 10. 2. No emergent large vessel occlusion or high-grade stenosis of the intracranial arteries. 3. Normal CT perfusion scan of the brain. Electronically Signed   By: Ulyses Jarred M.D.   On: 11/28/2021 00:34   DG Chest Port 1 View  Result Date: 11/28/2021 CLINICAL DATA:  Altered mental status. EXAM: PORTABLE CHEST 1 VIEW COMPARISON:  Chest radiograph dated 05/09/2016. FINDINGS: No focal consolidation, pleural effusion or pneumothorax. There is mild diffuse interstitial prominence which may be chronic. Atypical pneumonia is less likely but not excluded clinical correlation is recommended. There is mild cardiomegaly. No acute osseous pathology. IMPRESSION: 1. No focal consolidation. 2. Mild cardiomegaly. Electronically Signed   By: Anner Crete M.D.   On: 11/28/2021 00:59   CT HEAD CODE STROKE WO CONTRAST  Result Date: 11/28/2021 CLINICAL DATA:  Aphasia EXAM: CT HEAD CODE STROKE CT ANGIOGRAPHY HEAD AND NECK CT PERFUSION BRAIN TECHNIQUE: Multidetector CT imaging of the head was performed using the standard protocol without  administration of intravenous contrast Multidetector CT imaging of the head and neck was performed using the standard protocol during bolus administration of intravenous contrast. Multiplanar CT image reconstructions and MIPs were obtained to evaluate the vascular anatomy. Carotid stenosis measurements (when applicable) are obtained utilizing NASCET criteria, using the distal internal carotid diameter as the denominator. Multiphase CT imaging of the brain was performed following IV bolus contrast injection. Subsequent parametric perfusion maps were calculated using RAPID software. CONTRAST:  36mL OMNIPAQUE IOHEXOL 350 MG/ML SOLN; 17mL OMNIPAQUE IOHEXOL 350 MG/ML SOLN COMPARISON:  None. FINDINGS: CT HEAD FINDINGS Brain: There is no mass, hemorrhage or extra-axial collection. There is generalized atrophy without lobar predilection. The brain parenchyma is normal, without evidence of acute or chronic infarction. Vascular: No abnormal hyperdensity of the major intracranial arteries or dural venous sinuses. No intracranial atherosclerosis. Skull: The visualized skull base, calvarium and extracranial soft tissues are normal. Sinuses/Orbits: Complete opacification of the right maxillary sinus. The orbits are normal. ASPECTS ([place] Stroke Program Early CT Score) - Ganglionic level infarction (caudate, lentiform nuclei, internal capsule, insula, M1-M3 cortex): 7 - Supraganglionic infarction (M4-M6 cortex): 3 Total score (0-10 with 10 being normal): 10 These results were called by telephone at the time of interpretation on 11/28/2021 at 12:15 am to provider [facility] , who verbally acknowledged these results. CTA NECK FINDINGS SKELETON: There is no  bony spinal canal stenosis. No lytic or blastic lesion. OTHER NECK: Normal pharynx, larynx and major salivary glands. No cervical lymphadenopathy. Unremarkable thyroid gland. UPPER CHEST: No pneumothorax or pleural effusion. No nodules or masses. AORTIC ARCH: There is no  calcific atherosclerosis of the aortic arch. There is no aneurysm, dissection or hemodynamically significant stenosis of the visualized portion of the aorta. Conventional 3 vessel aortic branching pattern. The visualized proximal subclavian arteries are widely patent. RIGHT CAROTID SYSTEM: Normal without aneurysm, dissection or stenosis. LEFT CAROTID SYSTEM: Normal without aneurysm, dissection or stenosis. VERTEBRAL ARTERIES: Left dominant configuration. Both origins are clearly patent. There is no dissection, occlusion or flow-limiting stenosis to the skull base (V1-V3 segments). CTA HEAD FINDINGS POSTERIOR CIRCULATION: --Vertebral arteries: Normal V4 segments. --Inferior cerebellar arteries: Normal. --Basilar artery: Normal. --Superior cerebellar arteries: Normal. --Posterior cerebral arteries (PCA): Normal. ANTERIOR CIRCULATION: --Intracranial internal carotid arteries: Normal. --Anterior cerebral arteries (ACA): Normal. Both A1 segments are present. Patent anterior communicating artery (a-comm). --Middle cerebral arteries (MCA): Normal. VENOUS SINUSES: As permitted by contrast timing, patent. ANATOMIC VARIANTS: None Review of the MIP images confirms the above findings. CT Brain Perfusion Findings: ASPECTS: 10 CBF (<30%) Volume: 39mL Perfusion (Tmax>6.0s) volume: 72mL Mismatch Volume: 23mL Infarction Location:None IMPRESSION: 1. No intracranial hemorrhage. ASPECTS is 10. 2. No emergent large vessel occlusion or high-grade stenosis of the intracranial arteries. 3. Normal CT perfusion scan of the brain. Electronically Signed   By: Ulyses Jarred M.D.   On: 11/28/2021 00:34   CT ANGIO HEAD NECK W WO CM (CODE STROKE)  Result Date: 11/28/2021 CLINICAL DATA:  Aphasia EXAM: CT HEAD CODE STROKE CT ANGIOGRAPHY HEAD AND NECK CT PERFUSION BRAIN TECHNIQUE: Multidetector CT imaging of the head was performed using the standard protocol without administration of intravenous contrast Multidetector CT imaging of the head and neck  was performed using the standard protocol during bolus administration of intravenous contrast. Multiplanar CT image reconstructions and MIPs were obtained to evaluate the vascular anatomy. Carotid stenosis measurements (when applicable) are obtained utilizing NASCET criteria, using the distal internal carotid diameter as the denominator. Multiphase CT imaging of the brain was performed following IV bolus contrast injection. Subsequent parametric perfusion maps were calculated using RAPID software. CONTRAST:  28mL OMNIPAQUE IOHEXOL 350 MG/ML SOLN; 48mL OMNIPAQUE IOHEXOL 350 MG/ML SOLN COMPARISON:  None. FINDINGS: CT HEAD FINDINGS Brain: There is no mass, hemorrhage or extra-axial collection. There is generalized atrophy without lobar predilection. The brain parenchyma is normal, without evidence of acute or chronic infarction. Vascular: No abnormal hyperdensity of the major intracranial arteries or dural venous sinuses. No intracranial atherosclerosis. Skull: The visualized skull base, calvarium and extracranial soft tissues are normal. Sinuses/Orbits: Complete opacification of the right maxillary sinus. The orbits are normal. ASPECTS ([place] Stroke Program Early CT Score) - Ganglionic level infarction (caudate, lentiform nuclei, internal capsule, insula, M1-M3 cortex): 7 - Supraganglionic infarction (M4-M6 cortex): 3 Total score (0-10 with 10 being normal): 10 These results were called by telephone at the time of interpretation on 11/28/2021 at 12:15 am to provider [facility] , who verbally acknowledged these results. CTA NECK FINDINGS SKELETON: There is no bony spinal canal stenosis. No lytic or blastic lesion. OTHER NECK: Normal pharynx, larynx and major salivary glands. No cervical lymphadenopathy. Unremarkable thyroid gland. UPPER CHEST: No pneumothorax or pleural effusion. No nodules or masses. AORTIC ARCH: There is no calcific atherosclerosis of the aortic arch. There is no aneurysm, dissection or  hemodynamically significant stenosis of the visualized portion of the aorta. Conventional 3  vessel aortic branching pattern. The visualized proximal subclavian arteries are widely patent. RIGHT CAROTID SYSTEM: Normal without aneurysm, dissection or stenosis. LEFT CAROTID SYSTEM: Normal without aneurysm, dissection or stenosis. VERTEBRAL ARTERIES: Left dominant configuration. Both origins are clearly patent. There is no dissection, occlusion or flow-limiting stenosis to the skull base (V1-V3 segments). CTA HEAD FINDINGS POSTERIOR CIRCULATION: --Vertebral arteries: Normal V4 segments. --Inferior cerebellar arteries: Normal. --Basilar artery: Normal. --Superior cerebellar arteries: Normal. --Posterior cerebral arteries (PCA): Normal. ANTERIOR CIRCULATION: --Intracranial internal carotid arteries: Normal. --Anterior cerebral arteries (ACA): Normal. Both A1 segments are present. Patent anterior communicating artery (a-comm). --Middle cerebral arteries (MCA): Normal. VENOUS SINUSES: As permitted by contrast timing, patent. ANATOMIC VARIANTS: None Review of the MIP images confirms the above findings. CT Brain Perfusion Findings: ASPECTS: 10 CBF (<30%) Volume: 55mL Perfusion (Tmax>6.0s) volume: 15mL Mismatch Volume: 24mL Infarction Location:None IMPRESSION: 1. No intracranial hemorrhage. ASPECTS is 10. 2. No emergent large vessel occlusion or high-grade stenosis of the intracranial arteries. 3. Normal CT perfusion scan of the brain. Electronically Signed   By: Ulyses Jarred M.D.   On: 11/28/2021 00:34     TODAY-DAY OF DISCHARGE:  Subjective:   Jacob Meadows today has no headache,no chest abdominal pain,no new weakness tingling or numbness, feels much better wants to go home today.   Objective:   Blood pressure (!) 170/111, pulse 69, temperature 98 F (36.7 C), temperature source Oral, resp. rate 20, height 5\' 9"  (1.753 m), weight 97.8 kg, SpO2 99 %. No intake or output data in the 24 hours ending 11/28/21  1226 Filed Weights   11/27/21 2300  Weight: 97.8 kg    Exam: Awake Alert, Oriented *3, No new F.N deficits, Normal affect [place].AT,PERRAL Supple Neck,No JVD, No cervical lymphadenopathy appriciated.  Symmetrical Chest wall movement, Good air movement bilaterally, CTAB RRR,No Gallops,Rubs or new Murmurs, No Parasternal Heave +ve B.Sounds, Abd Soft, Non tender, No organomegaly appriciated, No rebound -guarding or rigidity. No Cyanosis, Clubbing or edema, No new Rash or bruise   PERTINENT RADIOLOGIC STUDIES: MR BRAIN WO CONTRAST  Result Date: 11/28/2021 CLINICAL DATA:  71 year old male code stroke presentation. Fall and blunt trauma. Aphasia. Unrevealing CTA and CTP. EXAM: MRI HEAD WITHOUT CONTRAST TECHNIQUE: Multiplanar, multiecho pulse sequences of the brain and surrounding structures were obtained without intravenous contrast. COMPARISON:  Head CT, CTA and CTP 0022 hours today. FINDINGS: Brain: No restricted diffusion to suggest acute infarction. No midline shift, mass effect, evidence of mass lesion, extra-axial collection or acute intracranial hemorrhage. Cervicomedullary junction and pituitary are within normal limits. Ventricular prominence is nonspecific but may be ex vacuo in nature. No obvious hyperdynamic flow at the cerebral aqueduct. Subtle areas of chronic cortical encephalomalacia in the bilateral PCA territories affecting the medial left and lateral right occipital lobes on series 11, image 13. No chronic cerebral blood products identified. Small chronic right superior cerebellar infarct on series 10, image 7. Otherwise largely normal for age gray and white matter signal throughout the brain. Vascular: Major intracranial vascular flow voids are preserved. Skull and upper cervical spine: Negative for age visible cervical spine. Visualized bone marrow signal is within normal limits. Sinuses/Orbits: Right maxillary sinus disease redemonstrated. Anterior ethmoid mucosal thickening also  stable. Negative orbits. Other: Mastoids are clear. Visible internal auditory structures appear normal. Scalp and face appear negative. IMPRESSION: 1. No acute intracranial abnormality. 2. Subtle chronic bilateral PCA territory infarcts, and small chronic right cerebellar infarct. 3. Ventricular prominence is nonspecific but may be ex vacuo in nature. Electronically Signed  By: Genevie Ann M.D.   On: 11/28/2021 06:34   US RENAL  Result Date: 11/28/2021 CLINICAL DATA:  Renal insufficiency EXAM: RENAL / URINARY TRACT ULTRASOUND COMPLETE COMPARISON:  None. FINDINGS: Right Kidney: Renal measurements: 10.4 x 4.8 x 4.9 cm = volume: 129 mL. Echogenicity within normal limits. No mass or hydronephrosis visualized. Left Kidney: Renal measurements: 11.6 x 5.3 x 5.5 cm = volume: 178 mL. Echogenicity within normal limits. No mass or hydronephrosis visualized. Bladder: Appears normal for degree of bladder distention. Other: None. IMPRESSION: No acute findings.  No hydronephrosis. Electronically Signed   By: Rolm Baptise M.D.   On: 11/28/2021 03:24   CT CEREBRAL PERFUSION W CONTRAST  Result Date: 11/28/2021 CLINICAL DATA:  Aphasia EXAM: CT HEAD CODE STROKE CT ANGIOGRAPHY HEAD AND NECK CT PERFUSION BRAIN TECHNIQUE: Multidetector CT imaging of the head was performed using the standard protocol without administration of intravenous contrast Multidetector CT imaging of the head and neck was performed using the standard protocol during bolus administration of intravenous contrast. Multiplanar CT image reconstructions and MIPs were obtained to evaluate the vascular anatomy. Carotid stenosis measurements (when applicable) are obtained utilizing NASCET criteria, using the distal internal carotid diameter as the denominator. Multiphase CT imaging of the brain was performed following IV bolus contrast injection. Subsequent parametric perfusion maps were calculated using RAPID software. CONTRAST:  13mL OMNIPAQUE IOHEXOL 350 MG/ML SOLN;  85mL OMNIPAQUE IOHEXOL 350 MG/ML SOLN COMPARISON:  None. FINDINGS: CT HEAD FINDINGS Brain: There is no mass, hemorrhage or extra-axial collection. There is generalized atrophy without lobar predilection. The brain parenchyma is normal, without evidence of acute or chronic infarction. Vascular: No abnormal hyperdensity of the major intracranial arteries or dural venous sinuses. No intracranial atherosclerosis. Skull: The visualized skull base, calvarium and extracranial soft tissues are normal. Sinuses/Orbits: Complete opacification of the right maxillary sinus. The orbits are normal. ASPECTS ([place] Stroke Program Early CT Score) - Ganglionic level infarction (caudate, lentiform nuclei, internal capsule, insula, M1-M3 cortex): 7 - Supraganglionic infarction (M4-M6 cortex): 3 Total score (0-10 with 10 being normal): 10 These results were called by telephone at the time of interpretation on 11/28/2021 at 12:15 am to provider [facility] , who verbally acknowledged these results. CTA NECK FINDINGS SKELETON: There is no bony spinal canal stenosis. No lytic or blastic lesion. OTHER NECK: Normal pharynx, larynx and major salivary glands. No cervical lymphadenopathy. Unremarkable thyroid gland. UPPER CHEST: No pneumothorax or pleural effusion. No nodules or masses. AORTIC ARCH: There is no calcific atherosclerosis of the aortic arch. There is no aneurysm, dissection or hemodynamically significant stenosis of the visualized portion of the aorta. Conventional 3 vessel aortic branching pattern. The visualized proximal subclavian arteries are widely patent. RIGHT CAROTID SYSTEM: Normal without aneurysm, dissection or stenosis. LEFT CAROTID SYSTEM: Normal without aneurysm, dissection or stenosis. VERTEBRAL ARTERIES: Left dominant configuration. Both origins are clearly patent. There is no dissection, occlusion or flow-limiting stenosis to the skull base (V1-V3 segments). CTA HEAD FINDINGS POSTERIOR CIRCULATION: --Vertebral  arteries: Normal V4 segments. --Inferior cerebellar arteries: Normal. --Basilar artery: Normal. --Superior cerebellar arteries: Normal. --Posterior cerebral arteries (PCA): Normal. ANTERIOR CIRCULATION: --Intracranial internal carotid arteries: Normal. --Anterior cerebral arteries (ACA): Normal. Both A1 segments are present. Patent anterior communicating artery (a-comm). --Middle cerebral arteries (MCA): Normal. VENOUS SINUSES: As permitted by contrast timing, patent. ANATOMIC VARIANTS: None Review of the MIP images confirms the above findings. CT Brain Perfusion Findings: ASPECTS: 10 CBF (<30%) Volume: 64mL Perfusion (Tmax>6.0s) volume: 16mL Mismatch Volume: 94mL Infarction Location:None IMPRESSION:  1. No intracranial hemorrhage. ASPECTS is 10. 2. No emergent large vessel occlusion or high-grade stenosis of the intracranial arteries. 3. Normal CT perfusion scan of the brain. Electronically Signed   By: Ulyses Jarred M.D.   On: 11/28/2021 00:34   DG Chest Port 1 View  Result Date: 11/28/2021 CLINICAL DATA:  Altered mental status. EXAM: PORTABLE CHEST 1 VIEW COMPARISON:  Chest radiograph dated 05/09/2016. FINDINGS: No focal consolidation, pleural effusion or pneumothorax. There is mild diffuse interstitial prominence which may be chronic. Atypical pneumonia is less likely but not excluded clinical correlation is recommended. There is mild cardiomegaly. No acute osseous pathology. IMPRESSION: 1. No focal consolidation. 2. Mild cardiomegaly. Electronically Signed   By: Anner Crete M.D.   On: 11/28/2021 00:59   CT HEAD CODE STROKE WO CONTRAST  Result Date: 11/28/2021 CLINICAL DATA:  Aphasia EXAM: CT HEAD CODE STROKE CT ANGIOGRAPHY HEAD AND NECK CT PERFUSION BRAIN TECHNIQUE: Multidetector CT imaging of the head was performed using the standard protocol without administration of intravenous contrast Multidetector CT imaging of the head and neck was performed using the standard protocol during bolus administration  of intravenous contrast. Multiplanar CT image reconstructions and MIPs were obtained to evaluate the vascular anatomy. Carotid stenosis measurements (when applicable) are obtained utilizing NASCET criteria, using the distal internal carotid diameter as the denominator. Multiphase CT imaging of the brain was performed following IV bolus contrast injection. Subsequent parametric perfusion maps were calculated using RAPID software. CONTRAST:  43mL OMNIPAQUE IOHEXOL 350 MG/ML SOLN; 31mL OMNIPAQUE IOHEXOL 350 MG/ML SOLN COMPARISON:  None. FINDINGS: CT HEAD FINDINGS Brain: There is no mass, hemorrhage or extra-axial collection. There is generalized atrophy without lobar predilection. The brain parenchyma is normal, without evidence of acute or chronic infarction. Vascular: No abnormal hyperdensity of the major intracranial arteries or dural venous sinuses. No intracranial atherosclerosis. Skull: The visualized skull base, calvarium and extracranial soft tissues are normal. Sinuses/Orbits: Complete opacification of the right maxillary sinus. The orbits are normal. ASPECTS ([place] Stroke Program Early CT Score) - Ganglionic level infarction (caudate, lentiform nuclei, internal capsule, insula, M1-M3 cortex): 7 - Supraganglionic infarction (M4-M6 cortex): 3 Total score (0-10 with 10 being normal): 10 These results were called by telephone at the time of interpretation on 11/28/2021 at 12:15 am to provider [facility] , who verbally acknowledged these results. CTA NECK FINDINGS SKELETON: There is no bony spinal canal stenosis. No lytic or blastic lesion. OTHER NECK: Normal pharynx, larynx and major salivary glands. No cervical lymphadenopathy. Unremarkable thyroid gland. UPPER CHEST: No pneumothorax or pleural effusion. No nodules or masses. AORTIC ARCH: There is no calcific atherosclerosis of the aortic arch. There is no aneurysm, dissection or hemodynamically significant stenosis of the visualized portion of the aorta.  Conventional 3 vessel aortic branching pattern. The visualized proximal subclavian arteries are widely patent. RIGHT CAROTID SYSTEM: Normal without aneurysm, dissection or stenosis. LEFT CAROTID SYSTEM: Normal without aneurysm, dissection or stenosis. VERTEBRAL ARTERIES: Left dominant configuration. Both origins are clearly patent. There is no dissection, occlusion or flow-limiting stenosis to the skull base (V1-V3 segments). CTA HEAD FINDINGS POSTERIOR CIRCULATION: --Vertebral arteries: Normal V4 segments. --Inferior cerebellar arteries: Normal. --Basilar artery: Normal. --Superior cerebellar arteries: Normal. --Posterior cerebral arteries (PCA): Normal. ANTERIOR CIRCULATION: --Intracranial internal carotid arteries: Normal. --Anterior cerebral arteries (ACA): Normal. Both A1 segments are present. Patent anterior communicating artery (a-comm). --Middle cerebral arteries (MCA): Normal. VENOUS SINUSES: As permitted by contrast timing, patent. ANATOMIC VARIANTS: None Review of the MIP images confirms the above findings.  CT Brain Perfusion Findings: ASPECTS: 10 CBF (<30%) Volume: 19mL Perfusion (Tmax>6.0s) volume: 58mL Mismatch Volume: 34mL Infarction Location:None IMPRESSION: 1. No intracranial hemorrhage. ASPECTS is 10. 2. No emergent large vessel occlusion or high-grade stenosis of the intracranial arteries. 3. Normal CT perfusion scan of the brain. Electronically Signed   By: Ulyses Jarred M.D.   On: 11/28/2021 00:34   CT ANGIO HEAD NECK W WO CM (CODE STROKE)  Result Date: 11/28/2021 CLINICAL DATA:  Aphasia EXAM: CT HEAD CODE STROKE CT ANGIOGRAPHY HEAD AND NECK CT PERFUSION BRAIN TECHNIQUE: Multidetector CT imaging of the head was performed using the standard protocol without administration of intravenous contrast Multidetector CT imaging of the head and neck was performed using the standard protocol during bolus administration of intravenous contrast. Multiplanar CT image reconstructions and MIPs were obtained to  evaluate the vascular anatomy. Carotid stenosis measurements (when applicable) are obtained utilizing NASCET criteria, using the distal internal carotid diameter as the denominator. Multiphase CT imaging of the brain was performed following IV bolus contrast injection. Subsequent parametric perfusion maps were calculated using RAPID software. CONTRAST:  72mL OMNIPAQUE IOHEXOL 350 MG/ML SOLN; 71mL OMNIPAQUE IOHEXOL 350 MG/ML SOLN COMPARISON:  None. FINDINGS: CT HEAD FINDINGS Brain: There is no mass, hemorrhage or extra-axial collection. There is generalized atrophy without lobar predilection. The brain parenchyma is normal, without evidence of acute or chronic infarction. Vascular: No abnormal hyperdensity of the major intracranial arteries or dural venous sinuses. No intracranial atherosclerosis. Skull: The visualized skull base, calvarium and extracranial soft tissues are normal. Sinuses/Orbits: Complete opacification of the right maxillary sinus. The orbits are normal. ASPECTS ([place] Stroke Program Early CT Score) - Ganglionic level infarction (caudate, lentiform nuclei, internal capsule, insula, M1-M3 cortex): 7 - Supraganglionic infarction (M4-M6 cortex): 3 Total score (0-10 with 10 being normal): 10 These results were called by telephone at the time of interpretation on 11/28/2021 at 12:15 am to provider [facility] , who verbally acknowledged these results. CTA NECK FINDINGS SKELETON: There is no bony spinal canal stenosis. No lytic or blastic lesion. OTHER NECK: Normal pharynx, larynx and major salivary glands. No cervical lymphadenopathy. Unremarkable thyroid gland. UPPER CHEST: No pneumothorax or pleural effusion. No nodules or masses. AORTIC ARCH: There is no calcific atherosclerosis of the aortic arch. There is no aneurysm, dissection or hemodynamically significant stenosis of the visualized portion of the aorta. Conventional 3 vessel aortic branching pattern. The visualized proximal subclavian  arteries are widely patent. RIGHT CAROTID SYSTEM: Normal without aneurysm, dissection or stenosis. LEFT CAROTID SYSTEM: Normal without aneurysm, dissection or stenosis. VERTEBRAL ARTERIES: Left dominant configuration. Both origins are clearly patent. There is no dissection, occlusion or flow-limiting stenosis to the skull base (V1-V3 segments). CTA HEAD FINDINGS POSTERIOR CIRCULATION: --Vertebral arteries: Normal V4 segments. --Inferior cerebellar arteries: Normal. --Basilar artery: Normal. --Superior cerebellar arteries: Normal. --Posterior cerebral arteries (PCA): Normal. ANTERIOR CIRCULATION: --Intracranial internal carotid arteries: Normal. --Anterior cerebral arteries (ACA): Normal. Both A1 segments are present. Patent anterior communicating artery (a-comm). --Middle cerebral arteries (MCA): Normal. VENOUS SINUSES: As permitted by contrast timing, patent. ANATOMIC VARIANTS: None Review of the MIP images confirms the above findings. CT Brain Perfusion Findings: ASPECTS: 10 CBF (<30%) Volume: 28mL Perfusion (Tmax>6.0s) volume: 30mL Mismatch Volume: 49mL Infarction Location:None IMPRESSION: 1. No intracranial hemorrhage. ASPECTS is 10. 2. No emergent large vessel occlusion or high-grade stenosis of the intracranial arteries. 3. Normal CT perfusion scan of the brain. Electronically Signed   By: Ulyses Jarred M.D.   On: 11/28/2021 00:34  PERTINENT LAB RESULTS: CBC: Recent Labs    11/27/21 2350 11/27/21 2356 11/28/21 0516  WBC 8.1  --  6.2  HGB 15.5 16.0 14.6  HCT 45.2 47.0 43.6  PLT 294  --  262   CMET CMP     Component Value Date/Time   NA 137 11/28/2021 0300   K 4.1 11/28/2021 0300   CL 103 11/28/2021 0300   CO2 20 (L) 11/28/2021 0300   GLUCOSE 252 (H) 11/28/2021 0300   BUN 23 11/28/2021 0300   CREATININE 1.30 (H) 11/28/2021 0300   CREATININE 0.97 01/28/2016 0758   CALCIUM 8.6 (L) 11/28/2021 0300   PROT 6.4 (L) 11/27/2021 2350   ALBUMIN 3.7 11/27/2021 2350   AST 11 (L) 11/27/2021 2350    ALT 20 11/27/2021 2350   ALKPHOS 62 11/27/2021 2350   BILITOT 0.6 11/27/2021 2350   GFRNONAA 58 (L) 11/28/2021 0300   GFRAA >60 05/09/2016 0900    GFR Estimated Creatinine Clearance: 59.2 mL/min (A) (by C-G formula based on SCr of 1.3 mg/dL (H)). No results for input(s): LIPASE, AMYLASE in the last 72 hours. Recent Labs    11/28/21 0035  CKTOTAL 36   Invalid input(s): POCBNP No results for input(s): DDIMER in the last 72 hours. No results for input(s): HGBA1C in the last 72 hours. No results for input(s): CHOL, HDL, LDLCALC, TRIG, CHOLHDL, LDLDIRECT in the last 72 hours. Recent Labs    11/28/21 0035  TSH 1.764   Recent Labs    11/28/21 0035  VITAMINB12 393   Coags: Recent Labs    11/27/21 2350  INR 1.1   Microbiology: Recent Results (from the past 240 hour(s))  Resp Panel by RT-PCR (Flu A&B, Covid) Urine, Clean Catch     Status: None   Collection Time: 11/28/21  3:00 AM   Specimen: Urine, Clean Catch; Nasopharyngeal(NP) swabs in vial transport medium  Result Value Ref Range Status   SARS Coronavirus 2 by RT PCR NEGATIVE NEGATIVE Final    Comment: (NOTE) SARS-CoV-2 target nucleic acids are NOT DETECTED.  The SARS-CoV-2 RNA is generally detectable in upper respiratory specimens during the acute phase of infection. The lowest concentration of SARS-CoV-2 viral copies this assay can detect is 138 copies/mL. A negative result does not preclude SARS-Cov-2 infection and should not be used as the sole basis for treatment or other patient management decisions. A negative result may occur with  improper specimen collection/handling, submission of specimen other than nasopharyngeal swab, presence of viral mutation(s) within the areas targeted by this assay, and inadequate number of viral copies(<138 copies/mL). A negative result must be combined with clinical observations, patient history, and epidemiological information. The expected result is Negative.  Fact Sheet  for Patients:  EntrepreneurPulse.com.au  Fact Sheet for Healthcare Providers:  IncredibleEmployment.be  This test is no t yet approved or cleared by the [place] FDA and  has been authorized for detection and/or diagnosis of SARS-CoV-2 by FDA under an Emergency Use Authorization (EUA). This EUA will remain  in effect (meaning this test can be used) for the duration of the COVID-19 declaration under Section 564(b)(1) of the Act, 21 U.S.C.section 360bbb-3(b)(1), unless the authorization is terminated  or revoked sooner.       Influenza A by PCR NEGATIVE NEGATIVE Final   Influenza B by PCR NEGATIVE NEGATIVE Final    Comment: (NOTE) The Xpert Xpress SARS-CoV-2/FLU/RSV plus assay is intended as an aid in the diagnosis of influenza from Nasopharyngeal swab specimens and should not be  used as a sole basis for treatment. Nasal washings and aspirates are unacceptable for Xpert Xpress SARS-CoV-2/FLU/RSV testing.  Fact Sheet for Patients: EntrepreneurPulse.com.au  Fact Sheet for Healthcare Providers: IncredibleEmployment.be  This test is not yet approved or cleared by the [place] FDA and has been authorized for detection and/or diagnosis of SARS-CoV-2 by FDA under an Emergency Use Authorization (EUA). This EUA will remain in effect (meaning this test can be used) for the duration of the COVID-19 declaration under Section 564(b)(1) of the Act, 21 U.S.C. section 360bbb-3(b)(1), unless the authorization is terminated or revoked.  Performed at [facility], [place] [address], [place], [place] [address]     FURTHER DISCHARGE INSTRUCTIONS:  Get Medicines reviewed and adjusted: Please take all your medications with you for your next visit with your Primary MD  Laboratory/radiological data: Please request your Primary MD to go over all hospital tests and procedure/radiological results at the follow up,  please ask your Primary MD to get all Hospital records sent to his/her office.  In some cases, they will be blood work, cultures and biopsy results pending at the time of your discharge. Please request that your primary care M.D. goes through all the records of your hospital data and follows up on these results.  Also Note the following: If you experience worsening of your admission symptoms, develop shortness of breath, life threatening emergency, suicidal or homicidal thoughts you must seek medical attention immediately by calling 911 or calling your MD immediately  if symptoms less severe.  You must read complete instructions/literature along with all the possible adverse reactions/side effects for all the Medicines you take and that have been prescribed to you. Take any new Medicines after you have completely understood and accpet all the possible adverse reactions/side effects.   Do not drive when taking Pain medications or sleeping medications (Benzodaizepines)  Do not take more than prescribed Pain, Sleep and Anxiety Medications. It is not advisable to combine anxiety,sleep and pain medications without talking with your primary care practitioner  Special Instructions: If you have smoked or chewed Tobacco  in the last 2 yrs please stop smoking, stop any regular Alcohol  and or any Recreational drug use.  Wear Seat belts while driving.  Please note: You were cared for by a hospitalist during your hospital stay. Once you are discharged, your primary care physician will handle any further medical issues. Please note that NO REFILLS for any discharge medications will be authorized once you are discharged, as it is imperative that you return to your primary care physician (or establish a relationship with a primary care physician if you do not have one) for your post hospital discharge needs so that they can reassess your need for medications and monitor your lab values.  Total Time spent  coordinating discharge including counseling, education and face to face time equals 35 minutes.  SignedOren Binet 11/28/2021 12:26 PM

## 2021-11-28 NOTE — H&P (Signed)
History and Physical    Jacob Meadows YHC:[id number] DOB: 03-03-1951 DOA: 11/27/2021  PCP: Rodrigo Ran, MD  Patient coming from: Home  I have personally briefly reviewed patient's old medical records in [facility]  Chief Complaint: Aphasia  HPI: Jacob Meadows is a 71 y.o. male with medical history significant of HTN, A.Fib on Eliquis.  Family heard a crash at 2300 and found pt on floor in living room, TV had fallen next to him.  Couldn't sit up on is own.  Pt with confusion.  EMS called.   ED Course: Came in to ED as code stroke but CT head neg, CT perfusion study also negative.  Pt was sick a week or two ago with URI like symptoms.  Symptoms persistent in ED.   Review of Systems: All systems reviewed and are negative except as per HPI.  Past Medical History:  Diagnosis Date   Atrial flutter (HCC)    "just the last week or so" (01/29/2016)   Essential hypertension 11/24/2015   High cholesterol    Nonspecific abnormal electrocardiogram (ECG) low voltage limb leads 11/24/2015   Persistent atrial fibrillation (HCC) 11/24/2015   Sleep-disordered breathing 11/24/2015    Past Surgical History:  Procedure Laterality Date   ATRIAL FIBRILLATION ABLATION  02/23/2016   ATRIAL FLUTTER ABLATION  02/23/2016   CARDIOVERSION N/A 12/16/2015   Procedure: CARDIOVERSION;  Surgeon: Chrystie Nose, MD;  Location: [facility] ENDOSCOPY;  Service: Cardiovascular;  Laterality: N/A;   CARDIOVERSION  12/2015   "done in ER"   ELECTROPHYSIOLOGIC STUDY N/A 02/23/2016   Procedure: Atrial Fibrillation Ablation;  Surgeon: Will Jorja Loa, MD;  Location: [facility] INVASIVE CV LAB;  Service: Cardiovascular;  Laterality: N/A;   TONSILLECTOMY  1957     reports that he has been smoking cigars. He has never used smokeless tobacco. He reports current alcohol use of about 7.0 - 9.0 standard drinks per week. He reports that he does not use drugs.  No Known Allergies  Family History  Problem Relation Age of Onset    Alzheimer's disease Mother    Congestive Heart Failure Father    Atrial fibrillation Father      Prior to Admission medications   Medication Sig Start Date End Date Taking? Authorizing Provider  atorvastatin (LIPITOR) 10 MG tablet Take 10 mg by mouth daily.   Yes [provider]  carvedilol (COREG) 25 MG tablet Take 1 tablet (25 mg total) by mouth 2 (two) times daily. 06/29/16  Yes Camnitz, Will Daphine Deutscher, MD  ELIQUIS 5 MG TABS tablet TAKE 1 TABLET TWICE DAILY. Patient taking differently: Take 5 mg by mouth 2 (two) times daily. 08/26/16  Yes Duke Salvia, MD  losartan (COZAAR) 100 MG tablet TAKE 1 TABLET ONCE DAILY. Patient taking differently: Take 100 mg by mouth daily. 08/26/16  Yes Sheilah Pigeon, PA-C  Multiple Vitamins-Minerals (CENTRUM SILVER ADULT 50+ PO) Take 1 tablet by mouth daily.   Yes [provider]    Physical Exam: Vitals:   11/27/21 2350 11/28/21 0030 11/28/21 0035 11/28/21 0100  BP: (!) 155/81 124/73 (!) 155/81 128/80  Pulse: 74 88 74 86  Resp: 16 18 16 20   Temp: 98.2 F (36.8 C) 97.8 F (36.6 C) 98.2 F (36.8 C)   TempSrc: Oral Temporal Oral   SpO2: 97% 94% 97% 94%  Weight:      Height:        Constitutional: NAD, calm, comfortable Eyes: PERRL, lids and conjunctivae normal ENMT: Mucous membranes  are moist. Posterior pharynx clear of any exudate or lesions.Normal dentition.  Neck: normal, supple, no masses, no thyromegaly Respiratory: clear to auscultation bilaterally, no wheezing, no crackles. Normal respiratory effort. No accessory muscle use.  Cardiovascular: Regular rate and rhythm, no murmurs / rubs / gallops. No extremity edema. 2+ pedal pulses. No carotid bruits.  Abdomen: no tenderness, no masses palpated. No hepatosplenomegaly. Bowel sounds positive.  Musculoskeletal: no clubbing / cyanosis. No joint deformity upper and lower extremities. Good ROM, no contractures. Normal muscle tone.  Skin: no rashes, lesions, ulcers. No  induration Neurologic: CN 2-12 grossly intact. Sensation intact, DTR normal. Strength 5/5 in all 4.  Psychiatric: AAOx3, doesn't seem very aphasic to me at the time of my eval and pt says he feels fine now.   Labs on Admission: I have personally reviewed following labs and imaging studies  CBC: Recent Labs  Lab 11/27/21 2350 11/27/21 2356  WBC 8.1  --   NEUTROABS 4.1  --   HGB 15.5 16.0  HCT 45.2 47.0  MCV 95.6  --   PLT 294  --    Basic Metabolic Panel: Recent Labs  Lab 11/27/21 2350 11/27/21 2356  NA 137 137  K 3.6 3.6  CL 102 103  CO2 20*  --   GLUCOSE 230* 224*  BUN 24* 26*  CREATININE 1.55* 1.80*  CALCIUM 9.0  --    GFR: Estimated Creatinine Clearance: 42.8 mL/min (A) (by C-G formula based on SCr of 1.8 mg/dL (H)). Liver Function Tests: Recent Labs  Lab 11/27/21 2350  AST 11*  ALT 20  ALKPHOS 62  BILITOT 0.6  PROT 6.4*  ALBUMIN 3.7   No results for input(s): LIPASE, AMYLASE in the last 168 hours. Recent Labs  Lab 11/28/21 0035  AMMONIA 31   Coagulation Profile: Recent Labs  Lab 11/27/21 2350  INR 1.1   Cardiac Enzymes: Recent Labs  Lab 11/28/21 0035  CKTOTAL 52   BNP (last 3 results) No results for input(s): PROBNP in the last 8760 hours. HbA1C: No results for input(s): HGBA1C in the last 72 hours. CBG: Recent Labs  Lab 11/27/21 2351  GLUCAP 231*   Lipid Profile: No results for input(s): CHOL, HDL, LDLCALC, TRIG, CHOLHDL, LDLDIRECT in the last 72 hours. Thyroid Function Tests: Recent Labs    11/28/21 0035  TSH 1.764   Anemia Panel: Recent Labs    11/28/21 0035  VITAMINB12 393   Urine analysis:    Component Value Date/Time   COLORURINE YELLOW 05/09/2016 1140   APPEARANCEUR CLEAR 05/09/2016 1140   LABSPEC 1.018 05/09/2016 1140   PHURINE 7.0 05/09/2016 1140   GLUCOSEU NEGATIVE 05/09/2016 1140   HGBUR NEGATIVE 05/09/2016 1140   BILIRUBINUR NEGATIVE 05/09/2016 1140   KETONESUR NEGATIVE 05/09/2016 1140   PROTEINUR  NEGATIVE 05/09/2016 1140   NITRITE NEGATIVE 05/09/2016 1140   LEUKOCYTESUR NEGATIVE 05/09/2016 1140    Radiological Exams on Admission: CT CEREBRAL PERFUSION W CONTRAST  Result Date: 11/28/2021 CLINICAL DATA:  Aphasia EXAM: CT HEAD CODE STROKE CT ANGIOGRAPHY HEAD AND NECK CT PERFUSION BRAIN TECHNIQUE: Multidetector CT imaging of the head was performed using the standard protocol without administration of intravenous contrast Multidetector CT imaging of the head and neck was performed using the standard protocol during bolus administration of intravenous contrast. Multiplanar CT image reconstructions and MIPs were obtained to evaluate the vascular anatomy. Carotid stenosis measurements (when applicable) are obtained utilizing NASCET criteria, using the distal internal carotid diameter as the denominator. Multiphase CT imaging of the  brain was performed following IV bolus contrast injection. Subsequent parametric perfusion maps were calculated using RAPID software. CONTRAST:  75mL OMNIPAQUE IOHEXOL 350 MG/ML SOLN; 50mL OMNIPAQUE IOHEXOL 350 MG/ML SOLN COMPARISON:  None. FINDINGS: CT HEAD FINDINGS Brain: There is no mass, hemorrhage or extra-axial collection. There is generalized atrophy without lobar predilection. The brain parenchyma is normal, without evidence of acute or chronic infarction. Vascular: No abnormal hyperdensity of the major intracranial arteries or dural venous sinuses. No intracranial atherosclerosis. Skull: The visualized skull base, calvarium and extracranial soft tissues are normal. Sinuses/Orbits: Complete opacification of the right maxillary sinus. The orbits are normal. ASPECTS (Alberta Stroke Program Early CT Score) - Ganglionic level infarction (caudate, lentiform nuclei, internal capsule, insula, M1-M3 cortex): 7 - Supraganglionic infarction (M4-M6 cortex): 3 Total score (0-10 with 10 being normal): 10 These results were called by telephone at the time of interpretation on 11/28/2021 at  12:15 am to provider [facility] , who verbally acknowledged these results. CTA NECK FINDINGS SKELETON: There is no bony spinal canal stenosis. No lytic or blastic lesion. OTHER NECK: Normal pharynx, larynx and major salivary glands. No cervical lymphadenopathy. Unremarkable thyroid gland. UPPER CHEST: No pneumothorax or pleural effusion. No nodules or masses. AORTIC ARCH: There is no calcific atherosclerosis of the aortic arch. There is no aneurysm, dissection or hemodynamically significant stenosis of the visualized portion of the aorta. Conventional 3 vessel aortic branching pattern. The visualized proximal subclavian arteries are widely patent. RIGHT CAROTID SYSTEM: Normal without aneurysm, dissection or stenosis. LEFT CAROTID SYSTEM: Normal without aneurysm, dissection or stenosis. VERTEBRAL ARTERIES: Left dominant configuration. Both origins are clearly patent. There is no dissection, occlusion or flow-limiting stenosis to the skull base (V1-V3 segments). CTA HEAD FINDINGS POSTERIOR CIRCULATION: --Vertebral arteries: Normal V4 segments. --Inferior cerebellar arteries: Normal. --Basilar artery: Normal. --Superior cerebellar arteries: Normal. --Posterior cerebral arteries (PCA): Normal. ANTERIOR CIRCULATION: --Intracranial internal carotid arteries: Normal. --Anterior cerebral arteries (ACA): Normal. Both A1 segments are present. Patent anterior communicating artery (a-comm). --Middle cerebral arteries (MCA): Normal. VENOUS SINUSES: As permitted by contrast timing, patent. ANATOMIC VARIANTS: None Review of the MIP images confirms the above findings. CT Brain Perfusion Findings: ASPECTS: 10 CBF (<30%) Volume: 0mL Perfusion (Tmax>6.0s) volume: 0mL Mismatch Volume: 0mL Infarction Location:None IMPRESSION: 1. No intracranial hemorrhage. ASPECTS is 10. 2. No emergent large vessel occlusion or high-grade stenosis of the intracranial arteries. 3. Normal CT perfusion scan of the brain. Electronically Signed   By:  Deatra Robinson M.D.   On: 11/28/2021 00:34   DG Chest Port 1 View  Result Date: 11/28/2021 CLINICAL DATA:  Altered mental status. EXAM: PORTABLE CHEST 1 VIEW COMPARISON:  Chest radiograph dated 05/09/2016. FINDINGS: No focal consolidation, pleural effusion or pneumothorax. There is mild diffuse interstitial prominence which may be chronic. Atypical pneumonia is less likely but not excluded clinical correlation is recommended. There is mild cardiomegaly. No acute osseous pathology. IMPRESSION: 1. No focal consolidation. 2. Mild cardiomegaly. Electronically Signed   By: Elgie Collard M.D.   On: 11/28/2021 00:59   CT HEAD CODE STROKE WO CONTRAST  Result Date: 11/28/2021 CLINICAL DATA:  Aphasia EXAM: CT HEAD CODE STROKE CT ANGIOGRAPHY HEAD AND NECK CT PERFUSION BRAIN TECHNIQUE: Multidetector CT imaging of the head was performed using the standard protocol without administration of intravenous contrast Multidetector CT imaging of the head and neck was performed using the standard protocol during bolus administration of intravenous contrast. Multiplanar CT image reconstructions and MIPs were obtained to evaluate the vascular anatomy. Carotid stenosis measurements (when  applicable) are obtained utilizing NASCET criteria, using the distal internal carotid diameter as the denominator. Multiphase CT imaging of the brain was performed following IV bolus contrast injection. Subsequent parametric perfusion maps were calculated using RAPID software. CONTRAST:  75mL OMNIPAQUE IOHEXOL 350 MG/ML SOLN; 50mL OMNIPAQUE IOHEXOL 350 MG/ML SOLN COMPARISON:  None. FINDINGS: CT HEAD FINDINGS Brain: There is no mass, hemorrhage or extra-axial collection. There is generalized atrophy without lobar predilection. The brain parenchyma is normal, without evidence of acute or chronic infarction. Vascular: No abnormal hyperdensity of the major intracranial arteries or dural venous sinuses. No intracranial atherosclerosis. Skull: The  visualized skull base, calvarium and extracranial soft tissues are normal. Sinuses/Orbits: Complete opacification of the right maxillary sinus. The orbits are normal. ASPECTS (Alberta Stroke Program Early CT Score) - Ganglionic level infarction (caudate, lentiform nuclei, internal capsule, insula, M1-M3 cortex): 7 - Supraganglionic infarction (M4-M6 cortex): 3 Total score (0-10 with 10 being normal): 10 These results were called by telephone at the time of interpretation on 11/28/2021 at 12:15 am to provider [facility] , who verbally acknowledged these results. CTA NECK FINDINGS SKELETON: There is no bony spinal canal stenosis. No lytic or blastic lesion. OTHER NECK: Normal pharynx, larynx and major salivary glands. No cervical lymphadenopathy. Unremarkable thyroid gland. UPPER CHEST: No pneumothorax or pleural effusion. No nodules or masses. AORTIC ARCH: There is no calcific atherosclerosis of the aortic arch. There is no aneurysm, dissection or hemodynamically significant stenosis of the visualized portion of the aorta. Conventional 3 vessel aortic branching pattern. The visualized proximal subclavian arteries are widely patent. RIGHT CAROTID SYSTEM: Normal without aneurysm, dissection or stenosis. LEFT CAROTID SYSTEM: Normal without aneurysm, dissection or stenosis. VERTEBRAL ARTERIES: Left dominant configuration. Both origins are clearly patent. There is no dissection, occlusion or flow-limiting stenosis to the skull base (V1-V3 segments). CTA HEAD FINDINGS POSTERIOR CIRCULATION: --Vertebral arteries: Normal V4 segments. --Inferior cerebellar arteries: Normal. --Basilar artery: Normal. --Superior cerebellar arteries: Normal. --Posterior cerebral arteries (PCA): Normal. ANTERIOR CIRCULATION: --Intracranial internal carotid arteries: Normal. --Anterior cerebral arteries (ACA): Normal. Both A1 segments are present. Patent anterior communicating artery (a-comm). --Middle cerebral arteries (MCA): Normal. VENOUS  SINUSES: As permitted by contrast timing, patent. ANATOMIC VARIANTS: None Review of the MIP images confirms the above findings. CT Brain Perfusion Findings: ASPECTS: 10 CBF (<30%) Volume: 0mL Perfusion (Tmax>6.0s) volume: 0mL Mismatch Volume: 0mL Infarction Location:None IMPRESSION: 1. No intracranial hemorrhage. ASPECTS is 10. 2. No emergent large vessel occlusion or high-grade stenosis of the intracranial arteries. 3. Normal CT perfusion scan of the brain. Electronically Signed   By: Deatra RobinsonKevin  Herman M.D.   On: 11/28/2021 00:34   CT ANGIO HEAD NECK W WO CM (CODE STROKE)  Result Date: 11/28/2021 CLINICAL DATA:  Aphasia EXAM: CT HEAD CODE STROKE CT ANGIOGRAPHY HEAD AND NECK CT PERFUSION BRAIN TECHNIQUE: Multidetector CT imaging of the head was performed using the standard protocol without administration of intravenous contrast Multidetector CT imaging of the head and neck was performed using the standard protocol during bolus administration of intravenous contrast. Multiplanar CT image reconstructions and MIPs were obtained to evaluate the vascular anatomy. Carotid stenosis measurements (when applicable) are obtained utilizing NASCET criteria, using the distal internal carotid diameter as the denominator. Multiphase CT imaging of the brain was performed following IV bolus contrast injection. Subsequent parametric perfusion maps were calculated using RAPID software. CONTRAST:  75mL OMNIPAQUE IOHEXOL 350 MG/ML SOLN; 50mL OMNIPAQUE IOHEXOL 350 MG/ML SOLN COMPARISON:  None. FINDINGS: CT HEAD FINDINGS Brain: There is no mass, hemorrhage or  extra-axial collection. There is generalized atrophy without lobar predilection. The brain parenchyma is normal, without evidence of acute or chronic infarction. Vascular: No abnormal hyperdensity of the major intracranial arteries or dural venous sinuses. No intracranial atherosclerosis. Skull: The visualized skull base, calvarium and extracranial soft tissues are normal.  Sinuses/Orbits: Complete opacification of the right maxillary sinus. The orbits are normal. ASPECTS (Alberta Stroke Program Early CT Score) - Ganglionic level infarction (caudate, lentiform nuclei, internal capsule, insula, M1-M3 cortex): 7 - Supraganglionic infarction (M4-M6 cortex): 3 Total score (0-10 with 10 being normal): 10 These results were called by telephone at the time of interpretation on 11/28/2021 at 12:15 am to provider [facility] , who verbally acknowledged these results. CTA NECK FINDINGS SKELETON: There is no bony spinal canal stenosis. No lytic or blastic lesion. OTHER NECK: Normal pharynx, larynx and major salivary glands. No cervical lymphadenopathy. Unremarkable thyroid gland. UPPER CHEST: No pneumothorax or pleural effusion. No nodules or masses. AORTIC ARCH: There is no calcific atherosclerosis of the aortic arch. There is no aneurysm, dissection or hemodynamically significant stenosis of the visualized portion of the aorta. Conventional 3 vessel aortic branching pattern. The visualized proximal subclavian arteries are widely patent. RIGHT CAROTID SYSTEM: Normal without aneurysm, dissection or stenosis. LEFT CAROTID SYSTEM: Normal without aneurysm, dissection or stenosis. VERTEBRAL ARTERIES: Left dominant configuration. Both origins are clearly patent. There is no dissection, occlusion or flow-limiting stenosis to the skull base (V1-V3 segments). CTA HEAD FINDINGS POSTERIOR CIRCULATION: --Vertebral arteries: Normal V4 segments. --Inferior cerebellar arteries: Normal. --Basilar artery: Normal. --Superior cerebellar arteries: Normal. --Posterior cerebral arteries (PCA): Normal. ANTERIOR CIRCULATION: --Intracranial internal carotid arteries: Normal. --Anterior cerebral arteries (ACA): Normal. Both A1 segments are present. Patent anterior communicating artery (a-comm). --Middle cerebral arteries (MCA): Normal. VENOUS SINUSES: As permitted by contrast timing, patent. ANATOMIC VARIANTS: None  Review of the MIP images confirms the above findings. CT Brain Perfusion Findings: ASPECTS: 10 CBF (<30%) Volume: 0mL Perfusion (Tmax>6.0s) volume: 0mL Mismatch Volume: 0mL Infarction Location:None IMPRESSION: 1. No intracranial hemorrhage. ASPECTS is 10. 2. No emergent large vessel occlusion or high-grade stenosis of the intracranial arteries. 3. Normal CT perfusion scan of the brain. Electronically Signed   By: Deatra Robinson M.D.   On: 11/28/2021 00:34    EKG: Independently reviewed.  Assessment/Plan Principal Problem:   Acute encephalopathy Active Problems:   Essential hypertension   AF (atrial fibrillation) (HCC)   Renal insufficiency    Acute encephalopathy, unclear cause - Neg or unremarkable studies thus far: CMP (other than creat elevation). CBC Ammonia TSH CPK B12 UA CXR CT head, CT cerebral perfusion study UDS Still pending MRI brain EEG Trop EtOH level Discussion - Wondering if symptoms arnt improving at this point, doesn't seem very aphasic at time of my eval.  Wondering if he may have just had a concussion? Renal insufficiency - New since 2017, but unclear if acute or chronic today Strict intake and output Repeat BMP in AM IVF: LR at 125 for now Check renal US HTN - Cont coreg Hold losartan due to renal insufficiency AF - Cont coreg Cont eliquis  DVT prophylaxis: Eliquis Code Status: Full Family Communication: No family in room Disposition Plan: TBD - pending work up and mental status improvement Consults called: Neuro Admission status: Place in obs     Tova Vater M. DO [facility]  How to contact the [facility] Attending or Consulting provider 7A - 7P or covering provider during after hours 7P -7A, for this patient?  Check the care team  in [facility] and look for a) attending/consulting [facility] provider listed and b) the [facility] team listed Log into [email or web address]  Amion Physician Scheduling and messaging for groups and whole hospitals  On call and  physician scheduling software for group practices, residents, hospitalists and other medical providers for call, clinic, rotation and shift schedules. OnCall [employer] is a hospital-wide system for scheduling doctors and paging doctors on call. EasyPlot is for scientific plotting and data analysis.  [email or web address]  and use [place]'s universal password to access. If you do not have the password, please contact the hospital operator.  Locate the [facility] provider you are looking for under [facility] and page to a number that you can be directly reached. If you still have difficulty reaching the provider, please page the [facility] (Director on Call) for the Hospitalists listed on amion for assistance.  11/28/2021, 2:46 AM

## 2021-11-28 NOTE — Progress Notes (Signed)
Trauma Response Nurse Documentation   Jacob Meadows is a 71 y.o. male arriving to [facility] ED via [facility]  On Eliquis (apixaban) daily. Trauma was activated as a Level 2 by charge nurse based on the following trauma criteria Elderly patients > 65 with head trauma on anti-coagulation (excluding ASA). Trauma team at the bedside on patient arrival. Patient cleared for CT by Dr. Leonette Monarch. Patient to CT with team. GCS 14.  History   Past Medical History:  Diagnosis Date   Atrial flutter ([place])    "just the last week or so" (01/29/2016)   Essential hypertension 11/24/2015   High cholesterol    Nonspecific abnormal electrocardiogram (ECG) low voltage limb leads 11/24/2015   Persistent atrial fibrillation ([place]) 11/24/2015   Sleep-disordered breathing 11/24/2015     Past Surgical History:  Procedure Laterality Date   ATRIAL FIBRILLATION ABLATION  02/23/2016   ATRIAL FLUTTER ABLATION  02/23/2016   CARDIOVERSION N/A 12/16/2015   Procedure: CARDIOVERSION;  Surgeon: Pixie Casino, MD;  Location: [facility] ENDOSCOPY;  Service: Cardiovascular;  Laterality: N/A;   CARDIOVERSION  12/2015   "done in ER"   ELECTROPHYSIOLOGIC STUDY N/A 02/23/2016   Procedure: Atrial Fibrillation Ablation;  Surgeon: Will Meredith Leeds, MD;  Location: [facility] CV LAB;  Service: Cardiovascular;  Laterality: N/A;   TONSILLECTOMY  1957       Initial Focused Assessment (If applicable, or please see trauma documentation): See Code Stroke/Trauma documentation  CT's Completed:   CT Head and CT C-Spine, CT head/neck angio  Interventions:  Met pt and primary RN, Neuro, pharmacy and EDP in CT, pt is being worked up as a code stroke. IV est by EMS, labs drawn on arrival.   Plan for disposition:  Admission to floor   Consults completed:  Neurology at bedside for code stroke workup  Event Summary:  Pt arrived via [facility] from home after family heard noise in pts room @ 2300, found on floor with flat screen TV on him. LSN 2100. Pt c/o pain  to back of head, no obvious injury, GCS 14, some expressive aphasia noted. Pupils equil, no other injuries noted. Pt denies pain on assessment nontender head/neck/chest/abd/pelvis.  Decision made to downgrade to nontrauma and continue with medical/ stroke work up @ [place].  Assisted primary RN with transport back to ED for further evaluation.      Bedside handoff with ED RN Addison Naegeli, RN.    Liadan Guizar, [place]  Trauma Response RN  Please call TRN at [phone] for further assistance.

## 2021-11-28 NOTE — Progress Notes (Addendum)
Admitted earlier this morning by my partner Dr. Alcario Drought. 71 year old with history of A. fib on Eliquis-presented with a fall-(family at bedside suspects it was a syncopal episode)-he was then found to be confused and aphasic for several hours.   This morning he is now fully awake and alert.  Speech is back to baseline.  Impression: Syncope/?seizure (neuroimaging including MRI brain/CTA head/neck negative for any significant abnormalities)  Plan: As outlined by Dr. Alcario Drought Check echo Awaiting EEG Orthostatic vital signs Ambulate with physical therapy Continue telemetry monitoring  Please see H&P dictated by Dr. Alcario Drought few hours ago.  No charge note.

## 2021-11-28 NOTE — ED Notes (Signed)
Patient discharge instructions reviewed with patient, patient advised on scheduling EEG and ECHO outpatient. Verbalized understanding of all instructions. Patient discharged.

## 2021-11-28 NOTE — Evaluation (Signed)
Occupational Therapy Evaluation Patient Details Name: Jacob Meadows MRN: [id number] DOB: 01-Jan-1951 Today's Date: 11/28/2021   History of Present Illness Pt is a 71 y.o. male who presented 11/27/21 s/p fall at home and a flat screen television fell on him. Pt also with confusion, unable to sit up, and with aphasia. tNKASE not administered. MRI negative for acute intracranial abnormality. Pt being worked up for toxic metabolic encephalopathy. PMH: HTN, A.Fib on Eliquis, HTN   Clinical Impression   Pt admitted for concerns listed above. PTA pt reported that he was independent with all ADL's and IADL's, including driving and yard work. At this time, pt appears back at his baseline with no difficulties or safety concerns. Pt has no further OT concerns and acute OT will sign off.      Recommendations for follow up therapy are one component of a multi-disciplinary discharge planning process, led by the attending physician.  Recommendations may be updated based on patient status, additional functional criteria and insurance authorization.   Follow Up Recommendations  No OT follow up    Assistance Recommended at Discharge None  Patient can return home with the following      Functional Status Assessment  Patient has not had a recent decline in their functional status  Equipment Recommendations  None recommended by OT    Recommendations for Other Services       Precautions / Restrictions Precautions Precautions: None Restrictions Weight Bearing Restrictions: No      Mobility Bed Mobility Overal bed mobility: Modified Independent             General bed mobility comments: Pt able to perform all bed mobility aspects safely without assistance.    Transfers Overall transfer level: Independent Equipment used: None               General transfer comment: Able to come to stand without LOB or difficulty.      Balance Overall balance assessment: No apparent balance deficits  (not formally assessed)                                         ADL either performed or assessed with clinical judgement   ADL Overall ADL's : Independent;At baseline                                       General ADL Comments: No difficulties noted with ADL's/mobility     Vision Baseline Vision/History: 0 No visual deficits Ability to See in Adequate Light: 0 Adequate Patient Visual Report: No change from baseline Vision Assessment?: No apparent visual deficits     Perception     Praxis      Pertinent Vitals/Pain Pain Assessment: No/denies pain     Hand Dominance Right   Extremity/Trunk Assessment Upper Extremity Assessment Upper Extremity Assessment: Overall WFL for tasks assessed   Lower Extremity Assessment Lower Extremity Assessment: Defer to PT evaluation   Cervical / Trunk Assessment Cervical / Trunk Assessment: Normal   Communication Communication Communication: No difficulties   Cognition Arousal/Alertness: Awake/alert Behavior During Therapy: WFL for tasks assessed/performed Overall Cognitive Status: Within Functional Limits for tasks assessed  General Comments: Pt has some memory loss around fall, otherwise memory/cognition appear intact.     General Comments  VSS on RA    Exercises     Shoulder Instructions      Home Living Family/patient expects to be discharged to:: Private residence Living Arrangements: Children Available Help at Discharge: Family Type of Home: House Home Access: Stairs to enter [employer] of Steps: 2 steps Entrance Stairs-Rails: Left;Right Home Layout: One level     Bathroom Shower/Tub: Walk-in shower;Tub/shower unit   Bathroom Toilet: Standard     Home Equipment: Cane - single point          Prior Functioning/Environment Prior Level of Function : Independent/Modified Independent;Driving             Mobility  Comments: indep ADLs Comments: indep        OT Problem List: Decreased activity tolerance;Cardiopulmonary status limiting activity      OT Treatment/Interventions:      OT Goals(Current goals can be found in the care plan section) Acute Rehab OT Goals Patient Stated Goal: To go home OT Goal Formulation: With patient Time For Goal Achievement: 11/28/21 Potential to Achieve Goals: Good  OT Frequency:      Co-evaluation PT/OT/SLP Co-Evaluation/Treatment: Yes Reason for Co-Treatment: To address functional/ADL transfers (pt d/cing)   OT goals addressed during session: ADL's and self-care;Strengthening/ROM      AM-PAC OT "6 Clicks" Daily Activity     Outcome Measure Help from another person eating meals?: None Help from another person taking care of personal grooming?: None Help from another person toileting, which includes using toliet, bedpan, or urinal?: None Help from another person bathing (including washing, rinsing, drying)?: None Help from another person to put on and taking off regular upper body clothing?: None Help from another person to put on and taking off regular lower body clothing?: None 6 Click Score: 24   End of Session Nurse Communication: Mobility status  Activity Tolerance: Patient tolerated treatment well Patient left: in bed;with call bell/phone within reach  OT Visit Diagnosis: Other abnormalities of gait and mobility (R26.89);Muscle weakness (generalized) (M62.81)                Time: 0102-7253 OT Time Calculation (min): 8 min Charges:  OT General Charges $OT Visit: 1 Visit OT Evaluation $OT Eval Low Complexity: 1 Low  Marc Sivertsen H., OTR/L Acute Rehabilitation  [name] 11/28/2021, 6:52 PM

## 2021-11-28 NOTE — Progress Notes (Signed)
Chaplain Keyaan Lederman responded to ED [facility] for Trauma Alert - Code-Stroke. 71 y.o. Male patient taken to CT upon arrival. Admitted via ambulance following a fall. Family was to be arriving. No contact made with patient or family. Advised nurse to page should they arrive. Response was later downgraded to Non-Trauma.

## 2021-11-28 NOTE — Progress Notes (Signed)
Informed by RN that patient would like to be discharged home-he would want to pursue EEG/echo in the outpatient setting.  He is much better and is back to his baseline.  He has been in the hallway bed in the emergency room since admission.  Discussed with neurologist-Dr. Ezzard Standing to discharge-EEG will be arranged in the outpatient setting.  Per patient-he will call his cardiologist office and get an echocardiogram.  Patient thinks that several months ago he started going in and out of A. fib-he has a history of ablation several years back.   Since he is clinically improved-and he does not want to remain hospitalized any longer-he will be discharged home at his own request.  See discharge summary for further details.

## 2021-11-28 NOTE — ED Provider Notes (Signed)
[facility] EMERGENCY DEPARTMENT Provider Note  CSN: [id number] Arrival date & time: 11/27/21 2346  Chief Complaint(s) Code Stroke  HPI Jacob Meadows is a 71 y.o. male with a past medical history listed below including hypertension, hyperlipidemia, atrial fibrillation on Eliquis here as a code stroke activated by EMS.  Last seen normal at 2100.  At 2300 family heard the patient fall in the living room.  The fall resulted in TV falling onto her next to the patient.  EMS called due to patient's confusion.  Code stroke initiated due to aphasia. CBGs within normal limits  The history is provided by the EMS personnel.   Past Medical History Past Medical History:  Diagnosis Date   Atrial flutter ([place])    "just the last week or so" (01/29/2016)   Essential hypertension 11/24/2015   High cholesterol    Nonspecific abnormal electrocardiogram (ECG) low voltage limb leads 11/24/2015   Persistent atrial fibrillation ([place]) 11/24/2015   Sleep-disordered breathing 11/24/2015   Patient Active Problem List   Diagnosis Date Noted   Renal insufficiency 11/28/2021   Acute encephalopathy 11/28/2021   AF (atrial fibrillation) ([place]) 02/23/2016   Atrial flutter ([place]) 01/29/2016   Accelerated hypertension 01/29/2016   Chest pain at rest 01/04/2016   Persistent atrial fibrillation ([place]) 11/24/2015   Essential hypertension 11/24/2015   Sleep-disordered breathing 11/24/2015   Nonspecific abnormal electrocardiogram (ECG) low voltage limb leads 11/24/2015   Home Medication(s) Prior to Admission medications   Medication Sig Start Date End Date Taking? Authorizing Provider  atorvastatin (LIPITOR) 10 MG tablet Take 10 mg by mouth daily.   Yes [provider]  carvedilol (COREG) 25 MG tablet Take 1 tablet (25 mg total) by mouth 2 (two) times daily. 06/29/16  Yes Camnitz, Will Hassell Done, MD  ELIQUIS 5 MG TABS tablet TAKE 1 TABLET TWICE DAILY. Patient taking differently: Take 5 mg by mouth 2  (two) times daily. 08/26/16  Yes Deboraha Sprang, MD  losartan (COZAAR) 100 MG tablet TAKE 1 TABLET ONCE DAILY. Patient taking differently: Take 100 mg by mouth daily. 08/26/16  Yes Baldwin Jamaica, PA-C  Multiple Vitamins-Minerals (CENTRUM SILVER ADULT 50+ PO) Take 1 tablet by mouth daily.   Yes [provider]                                                                                                                                    Allergies Patient has no known allergies.  Review of Systems Review of Systems As noted in HPI  Physical Exam Vital Signs  I have reviewed the triage vital signs BP (!) 178/119 (BP Location: Left Arm)    Pulse 88    Temp 98.6 F (37 C) (Oral)    Resp 17    Ht 5\' 9"  (1.753 m)    Wt 97.8 kg    SpO2 98%    BMI 31.82 kg/m   Physical  Exam Vitals reviewed.  Constitutional:      General: He is not in acute distress.    Appearance: He is well-developed. He is not diaphoretic.  HENT:     Head: Normocephalic and atraumatic.     Nose: Nose normal.  Eyes:     General: No scleral icterus.       Right eye: No discharge.        Left eye: No discharge.     Conjunctiva/sclera: Conjunctivae normal.     Pupils: Pupils are equal, round, and reactive to light.  Cardiovascular:     Rate and Rhythm: Normal rate and regular rhythm.     Heart sounds: No murmur heard.   No friction rub. No gallop.  Pulmonary:     Effort: Pulmonary effort is normal. No respiratory distress.     Breath sounds: Normal breath sounds. No stridor. No rales.  Abdominal:     General: There is no distension.     Palpations: Abdomen is soft.     Tenderness: There is no abdominal tenderness.  Musculoskeletal:        General: No tenderness.     Cervical back: Normal range of motion and neck supple.  Skin:    General: Skin is warm and dry.     Findings: No erythema or rash.  Neurological:     Mental Status: He is alert.     Comments: Patient with expressive aphasia No  facial droop noted. Ocular movements intact. Intermittently follows commands, but able to move all extremities. No dysmetria noted. Full neuro exam performed by neurologist    ED Results and Treatments Labs (all labs ordered are listed, but only abnormal results are displayed) Labs Reviewed  COMPREHENSIVE METABOLIC PANEL - Abnormal; Notable for the following components:      Result Value   CO2 20 (*)    Glucose, Bld 230 (*)    BUN 24 (*)    Creatinine, Ser 1.55 (*)    Total Protein 6.4 (*)    AST 11 (*)    GFR, Estimated 47 (*)    All other components within normal limits  URINALYSIS, ROUTINE W REFLEX MICROSCOPIC - Abnormal; Notable for the following components:   Specific Gravity, Urine <1.005 (*)    Glucose, UA >=500 (*)    Hgb urine dipstick TRACE (*)    All other components within normal limits  BASIC METABOLIC PANEL - Abnormal; Notable for the following components:   CO2 20 (*)    Glucose, Bld 252 (*)    Creatinine, Ser 1.30 (*)    Calcium 8.6 (*)    GFR, Estimated 58 (*)    All other components within normal limits  I-STAT CHEM 8, ED - Abnormal; Notable for the following components:   BUN 26 (*)    Creatinine, Ser 1.80 (*)    Glucose, Bld 224 (*)    Calcium, Ion 1.06 (*)    TCO2 20 (*)    All other components within normal limits  CBG MONITORING, ED - Abnormal; Notable for the following components:   Glucose-Capillary 231 (*)    All other components within normal limits  RESP PANEL BY RT-PCR (FLU A&B, COVID) ARPGX2  PROTIME-INR  APTT  CBC  DIFFERENTIAL  AMMONIA  RAPID URINE DRUG SCREEN, HOSP PERFORMED  CK  VITAMIN B12  TSH  URINALYSIS, MICROSCOPIC (REFLEX)  CBC  ETHANOL  METHYLMALONIC ACID, SERUM  TROPONIN I (HIGH SENSITIVITY)  EKG  EKG Interpretation  Date/Time:  Sunday November 28 2021 00:30:28 EST Ventricular Rate:  88 PR  Interval:    QRS Duration: 115 QT Interval:  383 QTC Calculation: 464 R Axis:   59 Text Interpretation: Atrial fibrillation Incomplete right bundle branch block Anteroseptal infarct, age indeterminate Confirmed by Addison Lank [phone]) on 11/28/2021 2:25:48 AM       Radiology MR BRAIN WO CONTRAST  Result Date: 11/28/2021 CLINICAL DATA:  71 year old male code stroke presentation. Fall and blunt trauma. Aphasia. Unrevealing CTA and CTP. EXAM: MRI HEAD WITHOUT CONTRAST TECHNIQUE: Multiplanar, multiecho pulse sequences of the brain and surrounding structures were obtained without intravenous contrast. COMPARISON:  Head CT, CTA and CTP 0022 hours today. FINDINGS: Brain: No restricted diffusion to suggest acute infarction. No midline shift, mass effect, evidence of mass lesion, extra-axial collection or acute intracranial hemorrhage. Cervicomedullary junction and pituitary are within normal limits. Ventricular prominence is nonspecific but may be ex vacuo in nature. No obvious hyperdynamic flow at the cerebral aqueduct. Subtle areas of chronic cortical encephalomalacia in the bilateral PCA territories affecting the medial left and lateral right occipital lobes on series 11, image 13. No chronic cerebral blood products identified. Small chronic right superior cerebellar infarct on series 10, image 7. Otherwise largely normal for age gray and white matter signal throughout the brain. Vascular: Major intracranial vascular flow voids are preserved. Skull and upper cervical spine: Negative for age visible cervical spine. Visualized bone marrow signal is within normal limits. Sinuses/Orbits: Right maxillary sinus disease redemonstrated. Anterior ethmoid mucosal thickening also stable. Negative orbits. Other: Mastoids are clear. Visible internal auditory structures appear normal. Scalp and face appear negative. IMPRESSION: 1. No acute intracranial abnormality. 2. Subtle chronic bilateral PCA territory infarcts, and  small chronic right cerebellar infarct. 3. Ventricular prominence is nonspecific but may be ex vacuo in nature. Electronically Signed   By: Genevie Ann M.D.   On: 11/28/2021 06:34   US RENAL  Result Date: 11/28/2021 CLINICAL DATA:  Renal insufficiency EXAM: RENAL / URINARY TRACT ULTRASOUND COMPLETE COMPARISON:  None. FINDINGS: Right Kidney: Renal measurements: 10.4 x 4.8 x 4.9 cm = volume: 129 mL. Echogenicity within normal limits. No mass or hydronephrosis visualized. Left Kidney: Renal measurements: 11.6 x 5.3 x 5.5 cm = volume: 178 mL. Echogenicity within normal limits. No mass or hydronephrosis visualized. Bladder: Appears normal for degree of bladder distention. Other: None. IMPRESSION: No acute findings.  No hydronephrosis. Electronically Signed   By: Rolm Baptise M.D.   On: 11/28/2021 03:24   CT CEREBRAL PERFUSION W CONTRAST  Result Date: 11/28/2021 CLINICAL DATA:  Aphasia EXAM: CT HEAD CODE STROKE CT ANGIOGRAPHY HEAD AND NECK CT PERFUSION BRAIN TECHNIQUE: Multidetector CT imaging of the head was performed using the standard protocol without administration of intravenous contrast Multidetector CT imaging of the head and neck was performed using the standard protocol during bolus administration of intravenous contrast. Multiplanar CT image reconstructions and MIPs were obtained to evaluate the vascular anatomy. Carotid stenosis measurements (when applicable) are obtained utilizing NASCET criteria, using the distal internal carotid diameter as the denominator. Multiphase CT imaging of the brain was performed following IV bolus contrast injection. Subsequent parametric perfusion maps were calculated using RAPID software. CONTRAST:  44mL OMNIPAQUE IOHEXOL 350 MG/ML SOLN; 65mL OMNIPAQUE IOHEXOL 350 MG/ML SOLN COMPARISON:  None. FINDINGS: CT HEAD FINDINGS Brain: There is no mass, hemorrhage or extra-axial collection. There is generalized atrophy without lobar predilection. The brain parenchyma is normal, without  evidence of acute or  chronic infarction. Vascular: No abnormal hyperdensity of the major intracranial arteries or dural venous sinuses. No intracranial atherosclerosis. Skull: The visualized skull base, calvarium and extracranial soft tissues are normal. Sinuses/Orbits: Complete opacification of the right maxillary sinus. The orbits are normal. ASPECTS ([place] Stroke Program Early CT Score) - Ganglionic level infarction (caudate, lentiform nuclei, internal capsule, insula, M1-M3 cortex): 7 - Supraganglionic infarction (M4-M6 cortex): 3 Total score (0-10 with 10 being normal): 10 These results were called by telephone at the time of interpretation on 11/28/2021 at 12:15 am to provider [facility] , who verbally acknowledged these results. CTA NECK FINDINGS SKELETON: There is no bony spinal canal stenosis. No lytic or blastic lesion. OTHER NECK: Normal pharynx, larynx and major salivary glands. No cervical lymphadenopathy. Unremarkable thyroid gland. UPPER CHEST: No pneumothorax or pleural effusion. No nodules or masses. AORTIC ARCH: There is no calcific atherosclerosis of the aortic arch. There is no aneurysm, dissection or hemodynamically significant stenosis of the visualized portion of the aorta. Conventional 3 vessel aortic branching pattern. The visualized proximal subclavian arteries are widely patent. RIGHT CAROTID SYSTEM: Normal without aneurysm, dissection or stenosis. LEFT CAROTID SYSTEM: Normal without aneurysm, dissection or stenosis. VERTEBRAL ARTERIES: Left dominant configuration. Both origins are clearly patent. There is no dissection, occlusion or flow-limiting stenosis to the skull base (V1-V3 segments). CTA HEAD FINDINGS POSTERIOR CIRCULATION: --Vertebral arteries: Normal V4 segments. --Inferior cerebellar arteries: Normal. --Basilar artery: Normal. --Superior cerebellar arteries: Normal. --Posterior cerebral arteries (PCA): Normal. ANTERIOR CIRCULATION: --Intracranial internal carotid  arteries: Normal. --Anterior cerebral arteries (ACA): Normal. Both A1 segments are present. Patent anterior communicating artery (a-comm). --Middle cerebral arteries (MCA): Normal. VENOUS SINUSES: As permitted by contrast timing, patent. ANATOMIC VARIANTS: None Review of the MIP images confirms the above findings. CT Brain Perfusion Findings: ASPECTS: 10 CBF (<30%) Volume: 50mL Perfusion (Tmax>6.0s) volume: 49mL Mismatch Volume: 45mL Infarction Location:None IMPRESSION: 1. No intracranial hemorrhage. ASPECTS is 10. 2. No emergent large vessel occlusion or high-grade stenosis of the intracranial arteries. 3. Normal CT perfusion scan of the brain. Electronically Signed   By: Ulyses Jarred M.D.   On: 11/28/2021 00:34   DG Chest Port 1 View  Result Date: 11/28/2021 CLINICAL DATA:  Altered mental status. EXAM: PORTABLE CHEST 1 VIEW COMPARISON:  Chest radiograph dated 05/09/2016. FINDINGS: No focal consolidation, pleural effusion or pneumothorax. There is mild diffuse interstitial prominence which may be chronic. Atypical pneumonia is less likely but not excluded clinical correlation is recommended. There is mild cardiomegaly. No acute osseous pathology. IMPRESSION: 1. No focal consolidation. 2. Mild cardiomegaly. Electronically Signed   By: Anner Crete M.D.   On: 11/28/2021 00:59   CT HEAD CODE STROKE WO CONTRAST  Result Date: 11/28/2021 CLINICAL DATA:  Aphasia EXAM: CT HEAD CODE STROKE CT ANGIOGRAPHY HEAD AND NECK CT PERFUSION BRAIN TECHNIQUE: Multidetector CT imaging of the head was performed using the standard protocol without administration of intravenous contrast Multidetector CT imaging of the head and neck was performed using the standard protocol during bolus administration of intravenous contrast. Multiplanar CT image reconstructions and MIPs were obtained to evaluate the vascular anatomy. Carotid stenosis measurements (when applicable) are obtained utilizing NASCET criteria, using the distal internal  carotid diameter as the denominator. Multiphase CT imaging of the brain was performed following IV bolus contrast injection. Subsequent parametric perfusion maps were calculated using RAPID software. CONTRAST:  42mL OMNIPAQUE IOHEXOL 350 MG/ML SOLN; 36mL OMNIPAQUE IOHEXOL 350 MG/ML SOLN COMPARISON:  None. FINDINGS: CT HEAD FINDINGS Brain: There is no mass, hemorrhage  or extra-axial collection. There is generalized atrophy without lobar predilection. The brain parenchyma is normal, without evidence of acute or chronic infarction. Vascular: No abnormal hyperdensity of the major intracranial arteries or dural venous sinuses. No intracranial atherosclerosis. Skull: The visualized skull base, calvarium and extracranial soft tissues are normal. Sinuses/Orbits: Complete opacification of the right maxillary sinus. The orbits are normal. ASPECTS ([place] Stroke Program Early CT Score) - Ganglionic level infarction (caudate, lentiform nuclei, internal capsule, insula, M1-M3 cortex): 7 - Supraganglionic infarction (M4-M6 cortex): 3 Total score (0-10 with 10 being normal): 10 These results were called by telephone at the time of interpretation on 11/28/2021 at 12:15 am to provider [facility] , who verbally acknowledged these results. CTA NECK FINDINGS SKELETON: There is no bony spinal canal stenosis. No lytic or blastic lesion. OTHER NECK: Normal pharynx, larynx and major salivary glands. No cervical lymphadenopathy. Unremarkable thyroid gland. UPPER CHEST: No pneumothorax or pleural effusion. No nodules or masses. AORTIC ARCH: There is no calcific atherosclerosis of the aortic arch. There is no aneurysm, dissection or hemodynamically significant stenosis of the visualized portion of the aorta. Conventional 3 vessel aortic branching pattern. The visualized proximal subclavian arteries are widely patent. RIGHT CAROTID SYSTEM: Normal without aneurysm, dissection or stenosis. LEFT CAROTID SYSTEM: Normal without aneurysm,  dissection or stenosis. VERTEBRAL ARTERIES: Left dominant configuration. Both origins are clearly patent. There is no dissection, occlusion or flow-limiting stenosis to the skull base (V1-V3 segments). CTA HEAD FINDINGS POSTERIOR CIRCULATION: --Vertebral arteries: Normal V4 segments. --Inferior cerebellar arteries: Normal. --Basilar artery: Normal. --Superior cerebellar arteries: Normal. --Posterior cerebral arteries (PCA): Normal. ANTERIOR CIRCULATION: --Intracranial internal carotid arteries: Normal. --Anterior cerebral arteries (ACA): Normal. Both A1 segments are present. Patent anterior communicating artery (a-comm). --Middle cerebral arteries (MCA): Normal. VENOUS SINUSES: As permitted by contrast timing, patent. ANATOMIC VARIANTS: None Review of the MIP images confirms the above findings. CT Brain Perfusion Findings: ASPECTS: 10 CBF (<30%) Volume: 43mL Perfusion (Tmax>6.0s) volume: 64mL Mismatch Volume: 7mL Infarction Location:None IMPRESSION: 1. No intracranial hemorrhage. ASPECTS is 10. 2. No emergent large vessel occlusion or high-grade stenosis of the intracranial arteries. 3. Normal CT perfusion scan of the brain. Electronically Signed   By: Ulyses Jarred M.D.   On: 11/28/2021 00:34   CT ANGIO HEAD NECK W WO CM (CODE STROKE)  Result Date: 11/28/2021 CLINICAL DATA:  Aphasia EXAM: CT HEAD CODE STROKE CT ANGIOGRAPHY HEAD AND NECK CT PERFUSION BRAIN TECHNIQUE: Multidetector CT imaging of the head was performed using the standard protocol without administration of intravenous contrast Multidetector CT imaging of the head and neck was performed using the standard protocol during bolus administration of intravenous contrast. Multiplanar CT image reconstructions and MIPs were obtained to evaluate the vascular anatomy. Carotid stenosis measurements (when applicable) are obtained utilizing NASCET criteria, using the distal internal carotid diameter as the denominator. Multiphase CT imaging of the brain was  performed following IV bolus contrast injection. Subsequent parametric perfusion maps were calculated using RAPID software. CONTRAST:  16mL OMNIPAQUE IOHEXOL 350 MG/ML SOLN; 78mL OMNIPAQUE IOHEXOL 350 MG/ML SOLN COMPARISON:  None. FINDINGS: CT HEAD FINDINGS Brain: There is no mass, hemorrhage or extra-axial collection. There is generalized atrophy without lobar predilection. The brain parenchyma is normal, without evidence of acute or chronic infarction. Vascular: No abnormal hyperdensity of the major intracranial arteries or dural venous sinuses. No intracranial atherosclerosis. Skull: The visualized skull base, calvarium and extracranial soft tissues are normal. Sinuses/Orbits: Complete opacification of the right maxillary sinus. The orbits are normal. ASPECTS [facility] Stroke  Program Early CT Score) - Ganglionic level infarction (caudate, lentiform nuclei, internal capsule, insula, M1-M3 cortex): 7 - Supraganglionic infarction (M4-M6 cortex): 3 Total score (0-10 with 10 being normal): 10 These results were called by telephone at the time of interpretation on 11/28/2021 at 12:15 am to provider [facility] , who verbally acknowledged these results. CTA NECK FINDINGS SKELETON: There is no bony spinal canal stenosis. No lytic or blastic lesion. OTHER NECK: Normal pharynx, larynx and major salivary glands. No cervical lymphadenopathy. Unremarkable thyroid gland. UPPER CHEST: No pneumothorax or pleural effusion. No nodules or masses. AORTIC ARCH: There is no calcific atherosclerosis of the aortic arch. There is no aneurysm, dissection or hemodynamically significant stenosis of the visualized portion of the aorta. Conventional 3 vessel aortic branching pattern. The visualized proximal subclavian arteries are widely patent. RIGHT CAROTID SYSTEM: Normal without aneurysm, dissection or stenosis. LEFT CAROTID SYSTEM: Normal without aneurysm, dissection or stenosis. VERTEBRAL ARTERIES: Left dominant configuration. Both  origins are clearly patent. There is no dissection, occlusion or flow-limiting stenosis to the skull base (V1-V3 segments). CTA HEAD FINDINGS POSTERIOR CIRCULATION: --Vertebral arteries: Normal V4 segments. --Inferior cerebellar arteries: Normal. --Basilar artery: Normal. --Superior cerebellar arteries: Normal. --Posterior cerebral arteries (PCA): Normal. ANTERIOR CIRCULATION: --Intracranial internal carotid arteries: Normal. --Anterior cerebral arteries (ACA): Normal. Both A1 segments are present. Patent anterior communicating artery (a-comm). --Middle cerebral arteries (MCA): Normal. VENOUS SINUSES: As permitted by contrast timing, patent. ANATOMIC VARIANTS: None Review of the MIP images confirms the above findings. CT Brain Perfusion Findings: ASPECTS: 10 CBF (<30%) Volume: 52mL Perfusion (Tmax>6.0s) volume: 40mL Mismatch Volume: 46mL Infarction Location:None IMPRESSION: 1. No intracranial hemorrhage. ASPECTS is 10. 2. No emergent large vessel occlusion or high-grade stenosis of the intracranial arteries. 3. Normal CT perfusion scan of the brain. Electronically Signed   By: Ulyses Jarred M.D.   On: 11/28/2021 00:34    Pertinent labs & imaging results that were available during my care of the patient were reviewed by me and considered in my medical decision making (see MDM for details).  Medications Ordered in ED Medications  acetaminophen (TYLENOL) tablet 650 mg (650 mg Oral Given 11/28/21 0816)    Or  acetaminophen (TYLENOL) suppository 650 mg ( Rectal See Alternative 11/28/21 0816)  ondansetron (ZOFRAN) tablet 4 mg (has no administration in time range)    Or  ondansetron (ZOFRAN) injection 4 mg (has no administration in time range)  lactated ringers infusion ( Intravenous New Bag/Given 11/28/21 0403)  carvedilol (COREG) tablet 25 mg (25 mg Oral Given 11/28/21 0816)  apixaban (ELIQUIS) tablet 5 mg (has no administration in time range)  atorvastatin (LIPITOR) tablet 10 mg (has no administration in time range)   sodium chloride flush (NS) 0.9 % injection 3 mL (3 mLs Intravenous Given 11/28/21 0105)  iohexol (OMNIPAQUE) 350 MG/ML injection 75 mL (75 mLs Intravenous Contrast Given 11/28/21 0013)  iohexol (OMNIPAQUE) 350 MG/ML injection 50 mL (50 mLs Intravenous Contrast Given 11/28/21 0023)  diazepam (VALIUM) injection 2.5 mg (2.5 mg Intravenous Given 11/28/21 0104)  Procedures Procedures  (including critical care time)  Medical Decision Making / ED Course        Confusion and aphasia in the setting of fall Activated code stroke by EMS Considering stroke versus concussion versus metabolic process.  Will need to rule out ICH due to fall on thinners. Also consider possible seizure Low suspicion for infectious process Work-up ordered to assess concerns above.  Labs and imaging independently interpreted by me and noted below: CBC without leukocytosis or anemia No significant electrolyte derangements.  Mild renal sufficiency. CT head without ICH. CTAs without obvious cutoff.  Formal reads from radiology noted above.  On reassessment patient symptoms have improved. He will be admitted to medicine for stroke work-up. Will also had EEG to rule out seizure. No apparent metabolic process as the etiology. No obvious infectious source.     Final Clinical Impression(s) / ED Diagnoses Final diagnoses:  AMS (altered mental status)  Aphasia           This chart was dictated using voice recognition software.  Despite best efforts to proofread,  errors can occur which can change the documentation meaning.    Fatima Blank, MD 11/28/21 [phone]

## 2021-11-30 LAB — METHYLMALONIC ACID, SERUM: Methylmalonic Acid, Quantitative: 321 nmol/L (ref 0–378)

## 2021-12-20 DIAGNOSIS — E785 Hyperlipidemia, unspecified: Secondary | ICD-10-CM | POA: Diagnosis not present

## 2021-12-20 DIAGNOSIS — I4891 Unspecified atrial fibrillation: Secondary | ICD-10-CM | POA: Diagnosis not present

## 2021-12-20 DIAGNOSIS — Z23 Encounter for immunization: Secondary | ICD-10-CM | POA: Diagnosis not present

## 2021-12-20 DIAGNOSIS — E1169 Type 2 diabetes mellitus with other specified complication: Secondary | ICD-10-CM | POA: Diagnosis not present

## 2021-12-20 DIAGNOSIS — I10 Essential (primary) hypertension: Secondary | ICD-10-CM | POA: Diagnosis not present

## 2021-12-20 DIAGNOSIS — D6869 Other thrombophilia: Secondary | ICD-10-CM | POA: Diagnosis not present

## 2022-01-04 ENCOUNTER — Other Ambulatory Visit: Payer: Self-pay

## 2022-01-04 ENCOUNTER — Ambulatory Visit: Payer: PPO | Admitting: Neurology

## 2022-01-04 ENCOUNTER — Encounter: Payer: Self-pay | Admitting: Neurology

## 2022-01-04 VITALS — BP 113/87 | HR 79 | Ht 69.0 in | Wt 200.0 lb

## 2022-01-04 DIAGNOSIS — R55 Syncope and collapse: Secondary | ICD-10-CM | POA: Diagnosis not present

## 2022-01-04 DIAGNOSIS — I63533 Cerebral infarction due to unspecified occlusion or stenosis of bilateral posterior cerebral arteries: Secondary | ICD-10-CM

## 2022-01-04 NOTE — Patient Instructions (Signed)
Routine EEG  °Follow up with PMD  °Return if worse  °

## 2022-01-04 NOTE — Progress Notes (Signed)
[facility]  PATIENT: Jacob Meadows DOB: 08/01/1951  REQUESTING CLINICIAN: Derek Jack, MD HISTORY FROM: Patient  REASON FOR VISIT: Syncope    HISTORICAL  CHIEF COMPLAINT:  Chief Complaint  Patient presents with   New Patient (Initial Visit)    Rm 13, alone  NP internal referral for acute encephalopathy,  Pt reports no new sx, states he has lost 17 lbs which has helped     HISTORY OF PRESENT ILLNESS:  This is a 71 year old gentleman past medical history of diabetes mellitus type 2, atrial fibrillation, hypertension and hyperlipidemia who is presenting after a syncopal episode.  Patient reports on the night of January 8, he was taking a nap on the sofa, and when daughter arrived he got up quickly and fell back on his butt and hit the back of his head.  Unsure about loss of consciousness but he reported being confused afterward.  He was taken to the ED, had stroke work-up including MRI, CT head and CT angiogram which was all unrevealing, his MRI brain however showed chronic bilateral PCA territories stroke and a  small right cerebellar stroke.  He reported he was back to his normal self within the first couple hours.  Since leaving the hospital he has been doing well, he started metformin for his diabetes and lose 17 pounds, stated that he watches his diet and increases exercise.  Denies any other history of seizures or seizure-like activity, denies any seizure risk factors.     OTHER MEDICAL CONDITIONS: Atrial fibrillation, diabetes mellitus type 2, hypertension, and hyperlipidemia   REVIEW OF SYSTEMS: Full 14 system review of systems performed and negative with exception of: As noted in HPI.  ALLERGIES: No Known Allergies  HOME MEDICATIONS: Outpatient Medications Prior to Visit  Medication Sig Dispense Refill   atorvastatin (LIPITOR) 10 MG tablet Take 10 mg by mouth daily.     carvedilol (COREG) 25 MG tablet Take 1 tablet (25 mg total) by mouth 2 (two)  times daily. 180 tablet 3   ELIQUIS 5 MG TABS tablet TAKE 1 TABLET TWICE DAILY. (Patient taking differently: Take 5 mg by mouth 2 (two) times daily.) 60 tablet 9   losartan (COZAAR) 100 MG tablet TAKE 1 TABLET ONCE DAILY. (Patient taking differently: Take 100 mg by mouth daily.) 30 tablet 9   metFORMIN (GLUCOPHAGE-XR) 500 MG 24 hr tablet take one twice a day with food     Multiple Vitamins-Minerals (CENTRUM SILVER ADULT 50+ PO) Take 1 tablet by mouth daily.     No facility-administered medications prior to visit.    PAST MEDICAL HISTORY: Past Medical History:  Diagnosis Date   Atrial flutter ([place])    "just the last week or so" (01/29/2016)   Essential hypertension 11/24/2015   High cholesterol    Nonspecific abnormal electrocardiogram (ECG) low voltage limb leads 11/24/2015   Persistent atrial fibrillation ([place]) 11/24/2015   Sleep-disordered breathing 11/24/2015    PAST SURGICAL HISTORY: Past Surgical History:  Procedure Laterality Date   ATRIAL FIBRILLATION ABLATION  02/23/2016   ATRIAL FLUTTER ABLATION  02/23/2016   CARDIOVERSION N/A 12/16/2015   Procedure: CARDIOVERSION;  Surgeon: Pixie Casino, MD;  Location: [facility] ENDOSCOPY;  Service: Cardiovascular;  Laterality: N/A;   CARDIOVERSION  12/2015   "done in ER"   ELECTROPHYSIOLOGIC STUDY N/A 02/23/2016   Procedure: Atrial Fibrillation Ablation;  Surgeon: Will Meredith Leeds, MD;  Location: [facility];  Service: Cardiovascular;  Laterality: N/A;   TONSILLECTOMY  1957  FAMILY HISTORY: Family History  Problem Relation Age of Onset   Alzheimer's disease Mother    Congestive Heart Failure Father    Atrial fibrillation Father     SOCIAL HISTORY: Social History   Socioeconomic History   Marital status: Single    Spouse name: Not on file   Number of children: Not on file   Years of education: Not on file   Highest education level: Not on file  Occupational History   Not on file  Tobacco Use   Smoking status: Some Days     Types: Cigars   Smokeless tobacco: Never  Vaping Use   Vaping Use: Never used  Substance and Sexual Activity   Alcohol use: Yes    Alcohol/week: 7.0 - 9.0 standard drinks    Types: 4 Glasses of wine, 2 Shots of liquor, 1 - 3 Standard drinks or equivalent per week   Drug use: No   Sexual activity: Yes  Other Topics Concern   Not on file  Social History Narrative   Not on file   Social Determinants of Health   Financial Resource Strain: Not on file  Food Insecurity: Not on file  Transportation Needs: Not on file  Physical Activity: Not on file  Stress: Not on file  Social Connections: Not on file  Intimate Partner Violence: Not on file    PHYSICAL EXAM  GENERAL EXAM/CONSTITUTIONAL: Vitals:  Vitals:   01/04/22 1035  BP: 113/87  Pulse: 79  Weight: 200 lb (90.7 kg)  Height: 5\' 9"  (1.753 m)   Body mass index is 29.53 kg/m. Wt Readings from Last 3 Encounters:  01/04/22 200 lb (90.7 kg)  11/27/21 215 lb 8 oz (97.8 kg)  03/10/20 201 lb 9.6 oz (91.4 kg)   Patient is in no distress; well developed, nourished and groomed; neck is supple  CARDIOVASCULAR: Examination of carotid arteries is normal; no carotid bruits Regular rate and rhythm, no murmurs Examination of peripheral vascular system by observation and palpation is normal  EYES: Pupils round and reactive to light, Visual fields full to confrontation, Extraocular movements intacts,   MUSCULOSKELETAL: Gait, strength, tone, movements noted in Neurologic exam below  NEUROLOGIC: MENTAL STATUS:  No flowsheet data found. awake, alert, oriented to person, place and time recent and remote memory intact normal attention and concentration language fluent, comprehension intact, naming intact fund of knowledge appropriate  CRANIAL NERVE:  2nd, 3rd, 4th, 6th - pupils equal and reactive to light, visual fields full to confrontation, extraocular muscles intact, no nystagmus 5th - facial sensation symmetric 7th - facial  strength symmetric 8th - hearing intact 9th - palate elevates symmetrically, uvula midline 11th - shoulder shrug symmetric 12th - tongue protrusion midline  MOTOR:  normal bulk and tone, full strength in the BUE, BLE  SENSORY:  normal and symmetric to light touch, pinprick, temperature, vibration  COORDINATION:  finger-nose-finger, fine finger movements normal  REFLEXES:  deep tendon reflexes present and symmetric  GAIT/STATION:  normal     DIAGNOSTIC DATA (LABS, IMAGING, TESTING) - I reviewed patient records, labs, notes, testing and imaging myself where available.  Lab Results  Component Value Date   WBC 6.2 11/28/2021   HGB 14.6 11/28/2021   HCT 43.6 11/28/2021   MCV 95.4 11/28/2021   PLT 262 11/28/2021      Component Value Date/Time   NA 137 11/28/2021 0300   K 4.1 11/28/2021 0300   CL 103 11/28/2021 0300   CO2 20 (L) 11/28/2021 0300  GLUCOSE 252 (H) 11/28/2021 0300   BUN 23 11/28/2021 0300   CREATININE 1.30 (H) 11/28/2021 0300   CREATININE 0.97 01/28/2016 0758   CALCIUM 8.6 (L) 11/28/2021 0300   PROT 6.4 (L) 11/27/2021 2350   ALBUMIN 3.7 11/27/2021 2350   AST 11 (L) 11/27/2021 2350   ALT 20 11/27/2021 2350   ALKPHOS 62 11/27/2021 2350   BILITOT 0.6 11/27/2021 2350   GFRNONAA 58 (L) 11/28/2021 0300   GFRAA >60 05/09/2016 0900   No results found for: CHOL, HDL, LDLCALC, LDLDIRECT, TRIG, CHOLHDL Lab Results  Component Value Date   HGBA1C 7.1 (H) 01/30/2016   Lab Results  Component Value Date   VITAMINB12 393 11/28/2021   Lab Results  Component Value Date   TSH 1.764 11/28/2021    MRI Brain without contrast 11/28/2021 1. No acute intracranial abnormality. 2. Subtle chronic bilateral PCA territory infarcts, and small chronic right cerebellar infarct. 3. Ventricular prominence is nonspecific but may be ex vacuo in nature.   CTA head and neck and CTP 11/28/2021 1. No intracranial hemorrhage. ASPECTS is 10. 2. No emergent large vessel occlusion  or high-grade stenosis of the intracranial arteries. 3. Normal CT perfusion scan of the brain.  I personally review MRI brain and CT angiogram head and neck and CT perfusion.   ASSESSMENT AND PLAN  71 y.o. year old male with vascular risk factor including atrial fibrillation, diabetes mellitus type 2, hypertension, and hyperlipidemia who is presenting after syncopal episode.  Patient reports standing up quickly then falling on his back and hitting his head with period of confusion afterward.  He was taken to the ED, noted to be aphasic and confused.  His stroke work-up has been negative for an acute stroke and he was planned to have a EEG but requested discharge because he felt much better.  I will complete his work-up by obtaining a routine EEG and I will contact the patient to go over the result. For his MRI which showed bilateral PCA territories strokes and a small right cerebral stroke, patient does not recall any period of time when he had any neurological deficit, he was not aware of these strokes but currently his stroke risk factors are well managed.  Advised him to continue current medications.   Return if worse or any new concern.   1. Syncope, unspecified syncope type   2. 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   Return if symptoms worsen or fail to improve.   I have spent a total of 45 minutes dedicated to this patient today, preparing to see patient, examining the patient, ordering tests and/or medications, and counseling the patient including preparing to see the patient (review of tests); performing a medically appropriate examination and evaluation; ordering test, and procedures; counseling and educating the  patient/family/caregiver; independent independently interpreting result and communicating results to the family/patient/caregiver; and documenting clinical information in the electronic medical record.    Alric Ran, MD 01/04/2022, 11:07 AM  [facility] [address] [place], [place] [address] [phone]

## 2022-01-12 ENCOUNTER — Other Ambulatory Visit: Payer: PPO | Admitting: *Deleted

## 2022-08-02 DIAGNOSIS — Z125 Encounter for screening for malignant neoplasm of prostate: Secondary | ICD-10-CM | POA: Diagnosis not present

## 2022-08-02 DIAGNOSIS — E559 Vitamin D deficiency, unspecified: Secondary | ICD-10-CM | POA: Diagnosis not present

## 2022-08-02 DIAGNOSIS — E785 Hyperlipidemia, unspecified: Secondary | ICD-10-CM | POA: Diagnosis not present

## 2022-08-02 DIAGNOSIS — E1169 Type 2 diabetes mellitus with other specified complication: Secondary | ICD-10-CM | POA: Diagnosis not present

## 2022-08-02 DIAGNOSIS — R7989 Other specified abnormal findings of blood chemistry: Secondary | ICD-10-CM | POA: Diagnosis not present

## 2022-08-02 DIAGNOSIS — I10 Essential (primary) hypertension: Secondary | ICD-10-CM | POA: Diagnosis not present

## 2022-08-09 ENCOUNTER — Other Ambulatory Visit: Payer: Self-pay | Admitting: Internal Medicine

## 2022-08-09 DIAGNOSIS — H6123 Impacted cerumen, bilateral: Secondary | ICD-10-CM | POA: Diagnosis not present

## 2022-08-09 DIAGNOSIS — Z23 Encounter for immunization: Secondary | ICD-10-CM | POA: Diagnosis not present

## 2022-08-09 DIAGNOSIS — E1169 Type 2 diabetes mellitus with other specified complication: Secondary | ICD-10-CM | POA: Diagnosis not present

## 2022-08-09 DIAGNOSIS — R9431 Abnormal electrocardiogram [ECG] [EKG]: Secondary | ICD-10-CM | POA: Diagnosis not present

## 2022-08-09 DIAGNOSIS — Z1331 Encounter for screening for depression: Secondary | ICD-10-CM | POA: Diagnosis not present

## 2022-08-09 DIAGNOSIS — N529 Male erectile dysfunction, unspecified: Secondary | ICD-10-CM | POA: Diagnosis not present

## 2022-08-09 DIAGNOSIS — E785 Hyperlipidemia, unspecified: Secondary | ICD-10-CM | POA: Diagnosis not present

## 2022-08-09 DIAGNOSIS — E559 Vitamin D deficiency, unspecified: Secondary | ICD-10-CM | POA: Diagnosis not present

## 2022-08-09 DIAGNOSIS — I4891 Unspecified atrial fibrillation: Secondary | ICD-10-CM | POA: Diagnosis not present

## 2022-08-09 DIAGNOSIS — Z0000 Encounter for general adult medical examination without abnormal findings: Secondary | ICD-10-CM | POA: Diagnosis not present

## 2022-08-09 DIAGNOSIS — I10 Essential (primary) hypertension: Secondary | ICD-10-CM | POA: Diagnosis not present

## 2022-08-09 DIAGNOSIS — D6869 Other thrombophilia: Secondary | ICD-10-CM | POA: Diagnosis not present

## 2022-08-18 DIAGNOSIS — H9193 Unspecified hearing loss, bilateral: Secondary | ICD-10-CM | POA: Diagnosis not present

## 2022-08-18 DIAGNOSIS — H6123 Impacted cerumen, bilateral: Secondary | ICD-10-CM | POA: Diagnosis not present

## 2022-09-02 ENCOUNTER — Other Ambulatory Visit: Payer: PPO

## 2022-11-28 DIAGNOSIS — Z1211 Encounter for screening for malignant neoplasm of colon: Secondary | ICD-10-CM | POA: Diagnosis not present

## 2023-03-15 DIAGNOSIS — H353231 Exudative age-related macular degeneration, bilateral, with active choroidal neovascularization: Secondary | ICD-10-CM | POA: Diagnosis not present

## 2023-08-07 ENCOUNTER — Other Ambulatory Visit: Payer: Self-pay | Admitting: Internal Medicine

## 2023-08-07 DIAGNOSIS — E785 Hyperlipidemia, unspecified: Secondary | ICD-10-CM

## 2023-09-18 DIAGNOSIS — E1169 Type 2 diabetes mellitus with other specified complication: Secondary | ICD-10-CM | POA: Diagnosis not present

## 2023-09-18 DIAGNOSIS — E785 Hyperlipidemia, unspecified: Secondary | ICD-10-CM | POA: Diagnosis not present

## 2023-09-18 DIAGNOSIS — Z0001 Encounter for general adult medical examination with abnormal findings: Secondary | ICD-10-CM | POA: Diagnosis not present

## 2023-09-18 DIAGNOSIS — E559 Vitamin D deficiency, unspecified: Secondary | ICD-10-CM | POA: Diagnosis not present

## 2023-09-26 DIAGNOSIS — R82998 Other abnormal findings in urine: Secondary | ICD-10-CM | POA: Diagnosis not present

## 2023-09-26 DIAGNOSIS — E559 Vitamin D deficiency, unspecified: Secondary | ICD-10-CM | POA: Diagnosis not present

## 2023-09-26 DIAGNOSIS — I10 Essential (primary) hypertension: Secondary | ICD-10-CM | POA: Diagnosis not present

## 2023-09-26 DIAGNOSIS — E1169 Type 2 diabetes mellitus with other specified complication: Secondary | ICD-10-CM | POA: Diagnosis not present

## 2023-09-26 DIAGNOSIS — Z1331 Encounter for screening for depression: Secondary | ICD-10-CM | POA: Diagnosis not present

## 2023-09-26 DIAGNOSIS — N529 Male erectile dysfunction, unspecified: Secondary | ICD-10-CM | POA: Diagnosis not present

## 2023-09-26 DIAGNOSIS — D6869 Other thrombophilia: Secondary | ICD-10-CM | POA: Diagnosis not present

## 2023-09-26 DIAGNOSIS — R9431 Abnormal electrocardiogram [ECG] [EKG]: Secondary | ICD-10-CM | POA: Diagnosis not present

## 2023-09-26 DIAGNOSIS — Z1339 Encounter for screening examination for other mental health and behavioral disorders: Secondary | ICD-10-CM | POA: Diagnosis not present

## 2023-09-26 DIAGNOSIS — I4891 Unspecified atrial fibrillation: Secondary | ICD-10-CM | POA: Diagnosis not present

## 2023-09-26 DIAGNOSIS — Z0000 Encounter for general adult medical examination without abnormal findings: Secondary | ICD-10-CM | POA: Diagnosis not present

## 2023-09-26 DIAGNOSIS — E785 Hyperlipidemia, unspecified: Secondary | ICD-10-CM | POA: Diagnosis not present

## 2023-09-26 DIAGNOSIS — H9193 Unspecified hearing loss, bilateral: Secondary | ICD-10-CM | POA: Diagnosis not present

## 2023-10-09 DIAGNOSIS — M25572 Pain in left ankle and joints of left foot: Secondary | ICD-10-CM | POA: Diagnosis not present

## 2024-04-09 DIAGNOSIS — E1169 Type 2 diabetes mellitus with other specified complication: Secondary | ICD-10-CM | POA: Diagnosis not present

## 2024-04-09 DIAGNOSIS — N529 Male erectile dysfunction, unspecified: Secondary | ICD-10-CM | POA: Diagnosis not present

## 2024-04-09 DIAGNOSIS — I4891 Unspecified atrial fibrillation: Secondary | ICD-10-CM | POA: Diagnosis not present

## 2024-04-09 DIAGNOSIS — I10 Essential (primary) hypertension: Secondary | ICD-10-CM | POA: Diagnosis not present

## 2024-04-09 DIAGNOSIS — R9431 Abnormal electrocardiogram [ECG] [EKG]: Secondary | ICD-10-CM | POA: Diagnosis not present

## 2024-04-09 DIAGNOSIS — E785 Hyperlipidemia, unspecified: Secondary | ICD-10-CM | POA: Diagnosis not present

## 2024-04-09 DIAGNOSIS — H9193 Unspecified hearing loss, bilateral: Secondary | ICD-10-CM | POA: Diagnosis not present

## 2024-04-09 DIAGNOSIS — D6869 Other thrombophilia: Secondary | ICD-10-CM | POA: Diagnosis not present

## 2024-04-09 DIAGNOSIS — E559 Vitamin D deficiency, unspecified: Secondary | ICD-10-CM | POA: Diagnosis not present

## 2024-10-31 ENCOUNTER — Ambulatory Visit: Admitting: Orthopaedic Surgery

## 2024-11-08 NOTE — Progress Notes (Signed)
 DEAVON PODGORSKI                                          MRN: [id number]   11/08/2024   The [facility] Quality Team Specialist reviewed this patient medical record for the purposes of chart review for care gap closure. The following were reviewed: chart review for care gap closure-kidney health evaluation for diabetes:eGFR  and uACR.    [facility] Quality Team

## 2024-12-10 ENCOUNTER — Other Ambulatory Visit: Payer: Self-pay | Admitting: Internal Medicine

## 2024-12-10 DIAGNOSIS — E785 Hyperlipidemia, unspecified: Secondary | ICD-10-CM

## 2025-01-02 ENCOUNTER — Other Ambulatory Visit
